# Patient Record
Sex: Male | Born: 1935 | Race: White | Hispanic: No | Marital: Married | Smoking: Former smoker
Health system: Southern US, Community
[De-identification: ages and names within clinical notes are randomized; demographics above are authoritative.]

## PROBLEM LIST (undated history)

## (undated) DIAGNOSIS — I2699 Other pulmonary embolism without acute cor pulmonale: Secondary | ICD-10-CM

## (undated) DIAGNOSIS — Z9289 Personal history of other medical treatment: Secondary | ICD-10-CM

## (undated) DIAGNOSIS — I2510 Atherosclerotic heart disease of native coronary artery without angina pectoris: Secondary | ICD-10-CM

## (undated) DIAGNOSIS — I272 Pulmonary hypertension, unspecified: Secondary | ICD-10-CM

## (undated) DIAGNOSIS — I10 Essential (primary) hypertension: Secondary | ICD-10-CM

## (undated) DIAGNOSIS — Z7901 Long term (current) use of anticoagulants: Secondary | ICD-10-CM

## (undated) DIAGNOSIS — R931 Abnormal findings on diagnostic imaging of heart and coronary circulation: Secondary | ICD-10-CM

## (undated) DIAGNOSIS — E785 Hyperlipidemia, unspecified: Secondary | ICD-10-CM

## (undated) DIAGNOSIS — I219 Acute myocardial infarction, unspecified: Secondary | ICD-10-CM

## (undated) DIAGNOSIS — I4821 Permanent atrial fibrillation: Secondary | ICD-10-CM

## (undated) HISTORY — DX: Personal history of other medical treatment: Z92.89

## (undated) HISTORY — DX: Long term (current) use of anticoagulants: Z79.01

## (undated) HISTORY — DX: Pulmonary hypertension, unspecified: I27.20

## (undated) HISTORY — DX: Hyperlipidemia, unspecified: E78.5

## (undated) HISTORY — DX: Permanent atrial fibrillation: I48.21

## (undated) HISTORY — DX: Abnormal findings on diagnostic imaging of heart and coronary circulation: R93.1

## (undated) HISTORY — DX: Other pulmonary embolism without acute cor pulmonale: I26.99

## (undated) HISTORY — PX: CORONARY ANGIOPLASTY WITH STENT PLACEMENT: SHX49

---

## 2009-05-26 DIAGNOSIS — Z7901 Long term (current) use of anticoagulants: Secondary | ICD-10-CM

## 2009-05-26 HISTORY — DX: Long term (current) use of anticoagulants: Z79.01

## 2009-07-23 ENCOUNTER — Ambulatory Visit: Payer: Self-pay | Admitting: Diagnostic Radiology

## 2009-07-23 ENCOUNTER — Emergency Department (HOSPITAL_BASED_OUTPATIENT_CLINIC_OR_DEPARTMENT_OTHER): Admission: EM | Admit: 2009-07-23 | Discharge: 2009-07-23 | Payer: Self-pay | Admitting: Emergency Medicine

## 2010-01-20 ENCOUNTER — Emergency Department (HOSPITAL_BASED_OUTPATIENT_CLINIC_OR_DEPARTMENT_OTHER): Admission: EM | Admit: 2010-01-20 | Discharge: 2010-01-20 | Payer: Self-pay | Admitting: Emergency Medicine

## 2010-05-23 ENCOUNTER — Inpatient Hospital Stay (HOSPITAL_COMMUNITY)
Admission: EM | Admit: 2010-05-23 | Discharge: 2010-06-04 | Payer: Self-pay | Source: Home / Self Care | Attending: Cardiovascular Disease | Admitting: Cardiovascular Disease

## 2010-05-23 DIAGNOSIS — I219 Acute myocardial infarction, unspecified: Secondary | ICD-10-CM

## 2010-05-23 DIAGNOSIS — I2699 Other pulmonary embolism without acute cor pulmonale: Secondary | ICD-10-CM

## 2010-05-23 HISTORY — DX: Other pulmonary embolism without acute cor pulmonale: I26.99

## 2010-05-23 HISTORY — DX: Acute myocardial infarction, unspecified: I21.9

## 2010-05-23 HISTORY — PX: CARDIAC CATHETERIZATION: SHX172

## 2010-05-26 DIAGNOSIS — I2510 Atherosclerotic heart disease of native coronary artery without angina pectoris: Secondary | ICD-10-CM

## 2010-05-26 HISTORY — DX: Atherosclerotic heart disease of native coronary artery without angina pectoris: I25.10

## 2010-05-28 HISTORY — PX: CARDIAC CATHETERIZATION: SHX172

## 2010-05-29 LAB — BASIC METABOLIC PANEL
BUN: 19 mg/dL (ref 6–23)
CO2: 27 mEq/L (ref 19–32)
Calcium: 8.8 mg/dL (ref 8.4–10.5)
Chloride: 109 mEq/L (ref 96–112)
Creatinine, Ser: 1.5 mg/dL (ref 0.4–1.5)
GFR calc Af Amer: 54 mL/min — ABNORMAL LOW (ref >60)
GFR calc non Af Amer: 44 mL/min — ABNORMAL LOW (ref >60)
Glucose, Bld: 106 mg/dL — ABNORMAL HIGH (ref 70–99)
Potassium: 4 mEq/L (ref 3.5–5.1)
Sodium: 140 mEq/L (ref 135–145)

## 2010-05-29 LAB — CBC
HCT: 38.2 % — ABNORMAL LOW (ref 39.0–52.0)
Hemoglobin: 12.6 g/dL — ABNORMAL LOW (ref 13.0–17.0)
MCH: 29.8 pg (ref 26.0–34.0)
MCHC: 33 g/dL (ref 30.0–36.0)
MCV: 90.3 fL (ref 78.0–100.0)
Platelets: 182 10*3/uL (ref 150–400)
RBC: 4.23 MIL/uL (ref 4.22–5.81)
RDW: 13.6 % (ref 11.5–15.5)
WBC: 6.6 10*3/uL (ref 4.0–10.5)

## 2010-05-29 LAB — D-DIMER, QUANTITATIVE: D-Dimer, Quant: 2.24 ug/mL-FEU — ABNORMAL HIGH (ref 0.00–0.48)

## 2010-05-30 LAB — BRAIN NATRIURETIC PEPTIDE: Pro B Natriuretic peptide (BNP): 632 pg/mL — ABNORMAL HIGH (ref 0.0–100.0)

## 2010-05-31 ENCOUNTER — Encounter (INDEPENDENT_AMBULATORY_CARE_PROVIDER_SITE_OTHER): Payer: Self-pay | Admitting: Cardiovascular Disease

## 2010-05-31 LAB — BASIC METABOLIC PANEL
BUN: 22 mg/dL (ref 6–23)
CO2: 30 mEq/L (ref 19–32)
Calcium: 9.2 mg/dL (ref 8.4–10.5)
Chloride: 100 mEq/L (ref 96–112)
Creatinine, Ser: 1.71 mg/dL — ABNORMAL HIGH (ref 0.4–1.5)
GFR calc Af Amer: 46 mL/min — ABNORMAL LOW (ref >60)
GFR calc non Af Amer: 38 mL/min — ABNORMAL LOW (ref >60)
Glucose, Bld: 97 mg/dL (ref 70–99)
Potassium: 4 mEq/L (ref 3.5–5.1)
Sodium: 139 mEq/L (ref 135–145)

## 2010-05-31 LAB — BRAIN NATRIURETIC PEPTIDE: Pro B Natriuretic peptide (BNP): 260 pg/mL — ABNORMAL HIGH (ref 0.0–100.0)

## 2010-06-10 LAB — PROTIME-INR
INR: 1.24 (ref 0.00–1.49)
INR: 1.89 — ABNORMAL HIGH (ref 0.00–1.49)
INR: 3.34 — ABNORMAL HIGH (ref 0.00–1.49)
INR: 3.68 — ABNORMAL HIGH (ref 0.00–1.49)
Prothrombin Time: 15.8 seconds — ABNORMAL HIGH (ref 11.6–15.2)
Prothrombin Time: 21.9 seconds — ABNORMAL HIGH (ref 11.6–15.2)
Prothrombin Time: 33.9 seconds — ABNORMAL HIGH (ref 11.6–15.2)
Prothrombin Time: 36.5 seconds — ABNORMAL HIGH (ref 11.6–15.2)

## 2010-06-10 LAB — CBC
HCT: 40.3 % (ref 39.0–52.0)
HCT: 38.1 % — ABNORMAL LOW (ref 39.0–52.0)
HCT: 37.8 % — ABNORMAL LOW (ref 39.0–52.0)
HCT: 37.7 % — ABNORMAL LOW (ref 39.0–52.0)
Hemoglobin: 13.2 g/dL (ref 13.0–17.0)
Hemoglobin: 12.7 g/dL — ABNORMAL LOW (ref 13.0–17.0)
Hemoglobin: 12.7 g/dL — ABNORMAL LOW (ref 13.0–17.0)
Hemoglobin: 12.5 g/dL — ABNORMAL LOW (ref 13.0–17.0)
MCH: 29.4 pg (ref 26.0–34.0)
MCH: 29.7 pg (ref 26.0–34.0)
MCH: 30 pg (ref 26.0–34.0)
MCH: 29.4 pg (ref 26.0–34.0)
MCHC: 32.8 g/dL (ref 30.0–36.0)
MCHC: 33.3 g/dL (ref 30.0–36.0)
MCHC: 33.6 g/dL (ref 30.0–36.0)
MCHC: 33.2 g/dL (ref 30.0–36.0)
MCV: 89.8 fL (ref 78.0–100.0)
MCV: 89 fL (ref 78.0–100.0)
MCV: 89.2 fL (ref 78.0–100.0)
MCV: 88.7 fL (ref 78.0–100.0)
Platelets: 245 10*3/uL (ref 150–400)
Platelets: 249 10*3/uL (ref 150–400)
Platelets: 242 10*3/uL (ref 150–400)
Platelets: 250 10*3/uL (ref 150–400)
RBC: 4.49 MIL/uL (ref 4.22–5.81)
RBC: 4.28 MIL/uL (ref 4.22–5.81)
RBC: 4.24 MIL/uL (ref 4.22–5.81)
RBC: 4.25 MIL/uL (ref 4.22–5.81)
RDW: 13.2 % (ref 11.5–15.5)
RDW: 13.3 % (ref 11.5–15.5)
RDW: 13.3 % (ref 11.5–15.5)
RDW: 13.1 % (ref 11.5–15.5)
WBC: 8.7 10*3/uL (ref 4.0–10.5)
WBC: 8.7 10*3/uL (ref 4.0–10.5)
WBC: 6.4 10*3/uL (ref 4.0–10.5)
WBC: 6.2 10*3/uL (ref 4.0–10.5)

## 2010-06-10 LAB — BASIC METABOLIC PANEL
BUN: 20 mg/dL (ref 6–23)
BUN: 24 mg/dL — ABNORMAL HIGH (ref 6–23)
CO2: 26 mEq/L (ref 19–32)
CO2: 26 mEq/L (ref 19–32)
Calcium: 8.9 mg/dL (ref 8.4–10.5)
Calcium: 8.9 mg/dL (ref 8.4–10.5)
Chloride: 105 mEq/L (ref 96–112)
Chloride: 105 mEq/L (ref 96–112)
Creatinine, Ser: 1.51 mg/dL — ABNORMAL HIGH (ref 0.4–1.5)
Creatinine, Ser: 1.52 mg/dL — ABNORMAL HIGH (ref 0.4–1.5)
GFR calc Af Amer: 53 mL/min — ABNORMAL LOW (ref >60)
GFR calc Af Amer: 53 mL/min — ABNORMAL LOW (ref >60)
GFR calc non Af Amer: 44 mL/min — ABNORMAL LOW (ref >60)
GFR calc non Af Amer: 44 mL/min — ABNORMAL LOW (ref >60)
Glucose, Bld: 105 mg/dL — ABNORMAL HIGH (ref 70–99)
Glucose, Bld: 125 mg/dL — ABNORMAL HIGH (ref 70–99)
Potassium: 3.9 mEq/L (ref 3.5–5.1)
Potassium: 3.8 mEq/L (ref 3.5–5.1)
Sodium: 138 mEq/L (ref 135–145)
Sodium: 139 mEq/L (ref 135–145)

## 2010-06-10 LAB — GLUCOSE, CAPILLARY: Glucose-Capillary: 132 mg/dL — ABNORMAL HIGH (ref 70–99)

## 2010-06-10 LAB — HEPARIN LEVEL (UNFRACTIONATED)
Heparin Unfractionated: 0.77 IU/mL — ABNORMAL HIGH (ref 0.30–0.70)
Heparin Unfractionated: 0.54 IU/mL (ref 0.30–0.70)
Heparin Unfractionated: 0.22 IU/mL — ABNORMAL LOW (ref 0.30–0.70)
Heparin Unfractionated: 0.46 IU/mL (ref 0.30–0.70)
Heparin Unfractionated: 0.79 IU/mL — ABNORMAL HIGH (ref 0.30–0.70)
Heparin Unfractionated: 0.95 IU/mL — ABNORMAL HIGH (ref 0.30–0.70)
Heparin Unfractionated: 0.47 IU/mL (ref 0.30–0.70)

## 2010-08-05 LAB — CBC
HCT: 36.6 % — ABNORMAL LOW (ref 39.0–52.0)
HCT: 37.6 % — ABNORMAL LOW (ref 39.0–52.0)
HCT: 38.7 % — ABNORMAL LOW (ref 39.0–52.0)
HCT: 42.6 % (ref 39.0–52.0)
Hemoglobin: 12.1 g/dL — ABNORMAL LOW (ref 13.0–17.0)
Hemoglobin: 13.7 g/dL (ref 13.0–17.0)
MCH: 28.8 pg (ref 26.0–34.0)
MCH: 30 pg (ref 26.0–34.0)
MCH: 30 pg (ref 26.0–34.0)
MCHC: 32.2 g/dL (ref 30.0–36.0)
MCHC: 32.8 g/dL (ref 30.0–36.0)
MCHC: 33.1 g/dL (ref 30.0–36.0)
MCHC: 33.3 g/dL (ref 30.0–36.0)
MCV: 89.7 fL (ref 78.0–100.0)
MCV: 90.6 fL (ref 78.0–100.0)
MCV: 90.6 fL (ref 78.0–100.0)
MCV: 90.8 fL (ref 78.0–100.0)
Platelets: 163 10*3/uL (ref 150–400)
Platelets: 190 10*3/uL (ref 150–400)
RBC: 4.04 MIL/uL — ABNORMAL LOW (ref 4.22–5.81)
RBC: 4.14 MIL/uL — ABNORMAL LOW (ref 4.22–5.81)
RBC: 4.75 MIL/uL (ref 4.22–5.81)
RDW: 13.7 % (ref 11.5–15.5)
RDW: 13.7 % (ref 11.5–15.5)
RDW: 13.8 % (ref 11.5–15.5)
RDW: 14 % (ref 11.5–15.5)
RDW: 14 % (ref 11.5–15.5)
WBC: 7.2 10*3/uL (ref 4.0–10.5)
WBC: 7.7 10*3/uL (ref 4.0–10.5)
WBC: 8.4 10*3/uL (ref 4.0–10.5)

## 2010-08-05 LAB — DIFFERENTIAL
Basophils Absolute: 0 10*3/uL (ref 0.0–0.1)
Basophils Relative: 0 % (ref 0–1)
Eosinophils Absolute: 0.1 10*3/uL (ref 0.0–0.7)
Eosinophils Relative: 1 % (ref 0–5)
Lymphocytes Relative: 12 % (ref 12–46)
Lymphs Abs: 1 10*3/uL (ref 0.7–4.0)
Monocytes Absolute: 0.6 10*3/uL (ref 0.1–1.0)
Monocytes Relative: 7 % (ref 3–12)
Neutro Abs: 6.7 10*3/uL (ref 1.7–7.7)
Neutrophils Relative %: 80 % — ABNORMAL HIGH (ref 43–77)

## 2010-08-05 LAB — BASIC METABOLIC PANEL
BUN: 20 mg/dL (ref 6–23)
BUN: 20 mg/dL (ref 6–23)
BUN: 26 mg/dL — ABNORMAL HIGH (ref 6–23)
BUN: 28 mg/dL — ABNORMAL HIGH (ref 6–23)
CO2: 26 mEq/L (ref 19–32)
CO2: 28 mEq/L (ref 19–32)
Calcium: 8.4 mg/dL (ref 8.4–10.5)
Calcium: 8.8 mg/dL (ref 8.4–10.5)
Calcium: 9.2 mg/dL (ref 8.4–10.5)
Chloride: 104 mEq/L (ref 96–112)
Chloride: 105 mEq/L (ref 96–112)
Chloride: 106 mEq/L (ref 96–112)
Creatinine, Ser: 1.33 mg/dL (ref 0.4–1.5)
Creatinine, Ser: 1.41 mg/dL (ref 0.4–1.5)
Creatinine, Ser: 1.66 mg/dL — ABNORMAL HIGH (ref 0.4–1.5)
GFR calc Af Amer: 48 mL/min — ABNORMAL LOW (ref >60)
GFR calc Af Amer: 58 mL/min — ABNORMAL LOW (ref >60)
GFR calc non Af Amer: 39 mL/min — ABNORMAL LOW (ref >60)
GFR calc non Af Amer: 48 mL/min — ABNORMAL LOW (ref >60)
GFR calc non Af Amer: 51 mL/min — ABNORMAL LOW (ref >60)
Glucose, Bld: 101 mg/dL — ABNORMAL HIGH (ref 70–99)
Glucose, Bld: 107 mg/dL — ABNORMAL HIGH (ref 70–99)
Glucose, Bld: 108 mg/dL — ABNORMAL HIGH (ref 70–99)
Glucose, Bld: 94 mg/dL (ref 70–99)
Potassium: 4 mEq/L (ref 3.5–5.1)
Potassium: 4 mEq/L (ref 3.5–5.1)
Potassium: 4.1 mEq/L (ref 3.5–5.1)
Sodium: 137 mEq/L (ref 135–145)
Sodium: 141 mEq/L (ref 135–145)

## 2010-08-05 LAB — CARDIAC PANEL(CRET KIN+CKTOT+MB+TROPI)
CK, MB: 138.1 ng/mL (ref 0.3–4.0)
CK, MB: 77.3 ng/mL (ref 0.3–4.0)
Relative Index: 12.8 — ABNORMAL HIGH (ref 0.0–2.5)
Relative Index: 15.6 — ABNORMAL HIGH (ref 0.0–2.5)
Total CK: 605 U/L — ABNORMAL HIGH (ref 7–232)
Total CK: 887 U/L — ABNORMAL HIGH (ref 7–232)
Troponin I: 31.68 ng/mL (ref 0.00–0.06)
Troponin I: 51.03 ng/mL (ref 0.00–0.06)

## 2010-08-05 LAB — COMPREHENSIVE METABOLIC PANEL
ALT: 30 U/L (ref 0–53)
AST: 85 U/L — ABNORMAL HIGH (ref 0–37)
Albumin: 3.8 g/dL (ref 3.5–5.2)
Alkaline Phosphatase: 39 U/L (ref 39–117)
BUN: 23 mg/dL (ref 6–23)
CO2: 28 mEq/L (ref 19–32)
Calcium: 9.2 mg/dL (ref 8.4–10.5)
Chloride: 101 mEq/L (ref 96–112)
Creatinine, Ser: 1.41 mg/dL (ref 0.4–1.5)
GFR calc Af Amer: 58 mL/min — ABNORMAL LOW (ref >60)
GFR calc non Af Amer: 48 mL/min — ABNORMAL LOW (ref >60)
Glucose, Bld: 117 mg/dL — ABNORMAL HIGH (ref 70–99)
Potassium: 3.7 mEq/L (ref 3.5–5.1)
Sodium: 137 mEq/L (ref 135–145)
Total Bilirubin: 1.1 mg/dL (ref 0.3–1.2)
Total Protein: 6.4 g/dL (ref 6.0–8.3)

## 2010-08-05 LAB — HEMOGLOBIN A1C
Hgb A1c MFr Bld: 6 % — ABNORMAL HIGH (ref <5.7)
Mean Plasma Glucose: 126 mg/dL — ABNORMAL HIGH (ref <117)

## 2010-08-05 LAB — PLATELET COUNT: Platelets: 185 10*3/uL (ref 150–400)

## 2010-08-05 LAB — LIPID PANEL
Cholesterol: 131 mg/dL (ref 0–200)
HDL: 38 mg/dL — ABNORMAL LOW (ref >39)
LDL Cholesterol: 80 mg/dL (ref 0–99)
Total CHOL/HDL Ratio: 3.4 RATIO
Triglycerides: 65 mg/dL (ref <150)
VLDL: 13 mg/dL (ref 0–40)

## 2010-08-05 LAB — POCT CARDIAC MARKERS
CKMB, poc: 65 ng/mL (ref 1.0–8.0)
Myoglobin, poc: >500 ng/mL (ref 12–200)
Troponin i, poc: 9.74 ng/mL (ref 0.00–0.09)

## 2010-08-05 LAB — BRAIN NATRIURETIC PEPTIDE: Pro B Natriuretic peptide (BNP): 194 pg/mL — ABNORMAL HIGH (ref 0.0–100.0)

## 2010-08-05 LAB — TROPONIN I: Troponin I: 20.07 ng/mL (ref 0.00–0.06)

## 2010-08-05 LAB — MAGNESIUM: Magnesium: 1.9 mg/dL (ref 1.5–2.5)

## 2010-08-05 LAB — PROTIME-INR
INR: 1.18 (ref 0.00–1.49)
INR: 1.21 (ref 0.00–1.49)
Prothrombin Time: 15.2 seconds (ref 11.6–15.2)
Prothrombin Time: 15.5 seconds — ABNORMAL HIGH (ref 11.6–15.2)

## 2010-08-05 LAB — APTT: aPTT: 193 seconds — ABNORMAL HIGH (ref 24–37)

## 2010-08-05 LAB — CK TOTAL AND CKMB (NOT AT ARMC)
CK, MB: 127.6 ng/mL (ref 0.3–4.0)
Relative Index: 17.1 — ABNORMAL HIGH (ref 0.0–2.5)
Total CK: 748 U/L — ABNORMAL HIGH (ref 7–232)

## 2010-08-05 LAB — LIPASE, BLOOD: Lipase: 29 U/L (ref 11–59)

## 2010-08-05 LAB — MRSA PCR SCREENING: MRSA by PCR: NEGATIVE

## 2010-08-09 LAB — BASIC METABOLIC PANEL
BUN: 22 mg/dL (ref 6–23)
CO2: 26 mEq/L (ref 19–32)
Calcium: 9.6 mg/dL (ref 8.4–10.5)
Creatinine, Ser: 1.5 mg/dL (ref 0.4–1.5)
GFR calc Af Amer: 54 mL/min — ABNORMAL LOW (ref >60)
Glucose, Bld: 130 mg/dL — ABNORMAL HIGH (ref 70–99)

## 2010-08-09 LAB — URINALYSIS, ROUTINE W REFLEX MICROSCOPIC
Bilirubin Urine: NEGATIVE
Ketones, ur: NEGATIVE mg/dL
Nitrite: NEGATIVE
pH: 6 (ref 5.0–8.0)

## 2010-08-09 LAB — DIFFERENTIAL
Basophils Absolute: 0.1 10*3/uL (ref 0.0–0.1)
Basophils Relative: 1 % (ref 0–1)
Eosinophils Absolute: 0 10*3/uL (ref 0.0–0.7)
Neutrophils Relative %: 87 % — ABNORMAL HIGH (ref 43–77)

## 2010-08-09 LAB — CBC
MCH: 31.9 pg (ref 26.0–34.0)
MCHC: 34.1 g/dL (ref 30.0–36.0)
Platelets: 227 10*3/uL (ref 150–400)
RBC: 5.05 MIL/uL (ref 4.22–5.81)
RDW: 13.7 % (ref 11.5–15.5)

## 2010-08-09 LAB — URINE CULTURE
Colony Count: NO GROWTH
Culture  Setup Time: 201108281945

## 2010-08-09 LAB — URINE MICROSCOPIC-ADD ON

## 2010-08-15 LAB — DIFFERENTIAL
Basophils Absolute: 0.1 10*3/uL (ref 0.0–0.1)
Lymphocytes Relative: 17 % (ref 12–46)
Monocytes Absolute: 0.4 10*3/uL (ref 0.1–1.0)
Monocytes Relative: 6 % (ref 3–12)
Neutro Abs: 4.4 10*3/uL (ref 1.7–7.7)
Neutrophils Relative %: 69 % (ref 43–77)

## 2010-08-15 LAB — BASIC METABOLIC PANEL
Calcium: 9.7 mg/dL (ref 8.4–10.5)
Creatinine, Ser: 1.5 mg/dL (ref 0.4–1.5)
GFR calc Af Amer: 54 mL/min — ABNORMAL LOW (ref >60)
GFR calc non Af Amer: 44 mL/min — ABNORMAL LOW (ref >60)
Sodium: 144 mEq/L (ref 135–145)

## 2010-08-15 LAB — CBC
Hemoglobin: 15 g/dL (ref 13.0–17.0)
RBC: 5.06 MIL/uL (ref 4.22–5.81)

## 2010-11-07 DIAGNOSIS — R931 Abnormal findings on diagnostic imaging of heart and coronary circulation: Secondary | ICD-10-CM

## 2010-11-07 DIAGNOSIS — Z9289 Personal history of other medical treatment: Secondary | ICD-10-CM

## 2010-11-07 HISTORY — DX: Abnormal findings on diagnostic imaging of heart and coronary circulation: R93.1

## 2010-11-07 HISTORY — DX: Personal history of other medical treatment: Z92.89

## 2011-01-05 ENCOUNTER — Emergency Department (HOSPITAL_BASED_OUTPATIENT_CLINIC_OR_DEPARTMENT_OTHER)
Admission: EM | Admit: 2011-01-05 | Discharge: 2011-01-05 | Disposition: A | Discharge status: Home / Self Care | Payer: Medicare Other | Attending: Emergency Medicine | Admitting: Emergency Medicine

## 2011-01-05 ENCOUNTER — Emergency Department (INDEPENDENT_AMBULATORY_CARE_PROVIDER_SITE_OTHER): Payer: Medicare Other

## 2011-01-05 ENCOUNTER — Other Ambulatory Visit: Payer: Self-pay

## 2011-01-05 ENCOUNTER — Encounter: Payer: Self-pay | Admitting: Emergency Medicine

## 2011-01-05 DIAGNOSIS — R0602 Shortness of breath: Secondary | ICD-10-CM

## 2011-01-05 DIAGNOSIS — R0989 Other specified symptoms and signs involving the circulatory and respiratory systems: Secondary | ICD-10-CM

## 2011-01-05 DIAGNOSIS — I509 Heart failure, unspecified: Secondary | ICD-10-CM | POA: Insufficient documentation

## 2011-01-05 DIAGNOSIS — M7989 Other specified soft tissue disorders: Secondary | ICD-10-CM | POA: Insufficient documentation

## 2011-01-05 DIAGNOSIS — J40 Bronchitis, not specified as acute or chronic: Secondary | ICD-10-CM | POA: Insufficient documentation

## 2011-01-05 DIAGNOSIS — J811 Chronic pulmonary edema: Secondary | ICD-10-CM

## 2011-01-05 DIAGNOSIS — J90 Pleural effusion, not elsewhere classified: Secondary | ICD-10-CM

## 2011-01-05 DIAGNOSIS — R05 Cough: Secondary | ICD-10-CM | POA: Insufficient documentation

## 2011-01-05 DIAGNOSIS — R059 Cough, unspecified: Secondary | ICD-10-CM | POA: Insufficient documentation

## 2011-01-05 DIAGNOSIS — I2510 Atherosclerotic heart disease of native coronary artery without angina pectoris: Secondary | ICD-10-CM | POA: Insufficient documentation

## 2011-01-05 DIAGNOSIS — I10 Essential (primary) hypertension: Secondary | ICD-10-CM | POA: Insufficient documentation

## 2011-01-05 DIAGNOSIS — I252 Old myocardial infarction: Secondary | ICD-10-CM | POA: Insufficient documentation

## 2011-01-05 HISTORY — DX: Acute myocardial infarction, unspecified: I21.9

## 2011-01-05 HISTORY — DX: Atherosclerotic heart disease of native coronary artery without angina pectoris: I25.10

## 2011-01-05 HISTORY — DX: Essential (primary) hypertension: I10

## 2011-01-05 LAB — COMPREHENSIVE METABOLIC PANEL
ALT: 18 U/L (ref 0–53)
AST: 23 U/L (ref 0–37)
CO2: 28 mEq/L (ref 19–32)
Calcium: 9.6 mg/dL (ref 8.4–10.5)
Chloride: 100 mEq/L (ref 96–112)
Creatinine, Ser: 1.2 mg/dL (ref 0.50–1.35)
GFR calc Af Amer: >60 mL/min (ref >60)
GFR calc non Af Amer: 57 mL/min — ABNORMAL LOW (ref >60)
Glucose, Bld: 111 mg/dL — ABNORMAL HIGH (ref 70–99)
Total Bilirubin: 1.3 mg/dL — ABNORMAL HIGH (ref 0.3–1.2)

## 2011-01-05 LAB — CBC
HCT: 40.3 % (ref 39.0–52.0)
Hemoglobin: 13.3 g/dL (ref 13.0–17.0)
MCH: 28.7 pg (ref 26.0–34.0)
MCHC: 33 g/dL (ref 30.0–36.0)
RBC: 4.63 MIL/uL (ref 4.22–5.81)

## 2011-01-05 LAB — DIFFERENTIAL
Eosinophils Absolute: 0 10*3/uL (ref 0.0–0.7)
Eosinophils Relative: 0 % (ref 0–5)
Lymphocytes Relative: 7 % — ABNORMAL LOW (ref 12–46)
Lymphs Abs: 0.8 10*3/uL (ref 0.7–4.0)
Monocytes Relative: 7 % (ref 3–12)

## 2011-01-05 LAB — PROTIME-INR: INR: 3.87 — ABNORMAL HIGH (ref 0.00–1.49)

## 2011-01-05 MED ORDER — ALBUTEROL SULFATE HFA 108 (90 BASE) MCG/ACT IN AERS: 2.0000 | INHALATION_SPRAY | RESPIRATORY_TRACT | Status: DC | PRN

## 2011-01-05 MED ORDER — ALBUTEROL SULFATE (5 MG/ML) 0.5% IN NEBU
2.5000 mg | INHALATION_SOLUTION | Freq: Once | RESPIRATORY_TRACT | Status: AC
  Administered 2011-01-05: 2.5 mg via RESPIRATORY_TRACT
  Filled 2011-01-05: qty 0.5

## 2011-01-05 MED ORDER — FUROSEMIDE 40 MG PO TABS: 40.0000 mg | ORAL_TABLET | Freq: Every day | ORAL | Status: DC

## 2011-01-05 NOTE — ED Notes (Signed)
Pt states he has been having dyspnea on exertion, swelling in feet and cough for over 6 months.  Pt states swelling in feet has worsened and wants to get checked out.  Pt states he feels sl Sob, has cough with yellow sputum.  No acute respiratory distress noted.  1+ pitting edema noted in feet/ankles up to shin.

## 2011-01-05 NOTE — ED Notes (Signed)
The patient is undressed and in a gown. The bed rails are up and the bed is locked and in the lowest position. The call light is within reach. A warm blanket has been given to the patient.

## 2011-01-05 NOTE — ED Notes (Signed)
Family at bedside. 

## 2011-01-05 NOTE — ED Provider Notes (Signed)
History     CSN: [id number] Arrival date & time: 01/05/2011 10:44 AM  Chief Complaint  Patient presents with  . Foot Swelling  . Cough  . Shortness of Breath   Patient is a 77 y.o. male presenting with cough. The history is provided by the patient.  Cough This is a new problem. Episode onset: Cough started about one week ago. The cough is non-productive. There has been no fever. Associated symptoms include shortness of breath and wheezing. Pertinent negatives include no chest pain, no sweats, no rhinorrhea and no myalgias. He has tried nothing for the symptoms.  Symptoms are worse at night, and he has had to sleep sitting up. He has had post-tussive emesis. He has chronic swelling of his feet which he states is unchanged.  Past Medical History  Diagnosis Date  . Coronary artery disease   . Hypertension   . Myocardial infarction     Past Surgical History  Procedure Date  . Cardiac surgery     two stents placed    History reviewed. No pertinent family history.  History  Substance Use Topics  . Smoking status: Never Smoker   . Smokeless tobacco: Not on file  . Alcohol Use: No      Review of Systems  HENT: Negative for rhinorrhea.   Respiratory: Positive for cough, shortness of breath and wheezing.   Cardiovascular: Negative for chest pain.  Musculoskeletal: Negative for myalgias.  All other systems reviewed and are negative.    Physical Exam  BP 138/88  Pulse 82  Temp(Src) 98.1 F (36.7 C) (Oral)  Resp 20  SpO2 96%  Physical Exam  Nursing note and vitals reviewed. Constitutional: He is oriented to person, place, and time. He appears well-developed and well-nourished. No distress.  HENT:  Head: Normocephalic and atraumatic.  Right Ear: External ear normal.  Left Ear: External ear normal.  Nose: Nose normal.  Mouth/Throat: Oropharynx is clear and moist.  Eyes: Conjunctivae and EOM are normal. Pupils are equal, round, and reactive to light. No scleral  icterus.  Neck: Normal range of motion. Neck supple. No JVD present.  Cardiovascular: Normal rate and normal heart sounds.   No murmur heard. Pulmonary/Chest: Effort normal. No respiratory distress. He has wheezes. He has rales.       Bibasilar rales, scattered wheezes that do not sound like cardiac asthma.  Abdominal: Soft. Bowel sounds are normal. He exhibits no mass. There is no tenderness.  Musculoskeletal: Normal range of motion. He exhibits edema. He exhibits no tenderness.       2+ presacral edema, 3+ pretibial edema.  Lymphadenopathy:    He has no cervical adenopathy.  Neurological: He is alert and oriented to person, place, and time. No cranial nerve deficit. Coordination normal.  Skin: Skin is warm and dry. No rash noted.  Psychiatric: He has a normal mood and affect.    ED Course  Procedures  Date: 01/05/2011  Rate: 84  Rhythm: atrial fibrillation, PVC's  QRS Axis: normal  Intervals: normal  ST/T Wave abnormalities: normal  Conduction Disutrbances:none  Narrative Interpretation: Atrial fibrillation with low QRS voltage  Old EKG Reviewed: unchanged           MDM Bronchitis and CHF, need to get CXR to rule out pneumonia.  Significant improvement with Albuterol Will need to treat for CHF and Bronchitis.  Labs and x-rays reviewed, images viewed by me.  Results for orders placed during the hospital encounter of 01/05/11  CBC  Component Value Range   WBC 11.9 (*) 4.0 - 10.5 (K/uL)   RBC 4.63  4.22 - 5.81 (MIL/uL)   Hemoglobin 13.3  13.0 - 17.0 (g/dL)   HCT 16.1  09.6 - 04.5 (%)   MCV 87.0  78.0 - 100.0 (fL)   MCH 28.7  26.0 - 34.0 (pg)   MCHC 33.0  30.0 - 36.0 (g/dL)   RDW 40.9  81.1 - 91.4 (%)   Platelets 200  150 - 400 (K/uL)  DIFFERENTIAL      Component Value Range   Neutrophils Relative 86 (*) 43 - 77 (%)   Neutro Abs 10.3 (*) 1.7 - 7.7 (K/uL)   Lymphocytes Relative 7 (*) 12 - 46 (%)   Lymphs Abs 0.8  0.7 - 4.0 (K/uL)   Monocytes Relative 7  3 -  12 (%)   Monocytes Absolute 0.9  0.1 - 1.0 (K/uL)   Eosinophils Relative 0  0 - 5 (%)   Eosinophils Absolute 0.0  0.0 - 0.7 (K/uL)   Basophils Relative 0  0 - 1 (%)   Basophils Absolute 0.0  0.0 - 0.1 (K/uL)   RBC Morphology POLYCHROMASIA PRESENT    COMPREHENSIVE METABOLIC PANEL      Component Value Range   Sodium 141  135 - 145 (mEq/L)   Potassium 3.2 (*) 3.5 - 5.1 (mEq/L)   Chloride 100  96 - 112 (mEq/L)   CO2 28  19 - 32 (mEq/L)   Glucose, Bld 111 (*) 70 - 99 (mg/dL)   BUN 21  6 - 23 (mg/dL)   Creatinine, Ser 7.82  0.50 - 1.35 (mg/dL)   Calcium 9.6  8.4 - 95.6 (mg/dL)   Total Protein 7.1  6.0 - 8.3 (g/dL)   Albumin 3.1 (*) 3.5 - 5.2 (g/dL)   AST 23  0 - 37 (U/L)   ALT 18  0 - 53 (U/L)   Alkaline Phosphatase 63  39 - 117 (U/L)   Total Bilirubin 1.3 (*) 0.3 - 1.2 (mg/dL)   GFR calc non Af Amer 57 (*) >60 (mL/min)   GFR calc Af Amer >60  >60 (mL/min)  PROTIME-INR      Component Value Range   Prothrombin Time 38.6 (*) 11.6 - 15.2 (seconds)   INR 3.87 (*) 0.00 - 1.49   TROPONIN I      Component Value Range   Troponin I <0.30  <0.30 (ng/mL)  PRO B NATRIURETIC PEPTIDE      Component Value Range   BNP, POC 2256.0 (*) 0 - 450 (pg/mL)   Dg Chest 2 View  01/05/2011  *RADIOLOGY REPORT*  Clinical Data: Shortness of breath, dyspnea on exertion  CHEST - 2 VIEW  Comparison: 05/29/2010  Findings: Mild perihilar and bibasilar interstitial edema or infiltrates.  Interval decrease in small pleural effusions.  Stable mild cardiomegaly.  Spondylitic changes in the thoracic spine.  IMPRESSION:  1.  Mild interstitial edema with interval decrease in small effusions.  Original Report Authenticated By: Osa Craver, M.D.      Dione Booze, MD 01/05/11 1335

## 2011-01-05 NOTE — ED Notes (Signed)
nd family verablize care plan and follow up well pt reports "breathing well now"

## 2012-05-11 ENCOUNTER — Encounter: Payer: Self-pay | Admitting: Pharmacist Clinician (PhC)/ Clinical Pharmacy Specialist

## 2012-07-16 ENCOUNTER — Ambulatory Visit
Admission: RE | Admit: 2012-07-16 | Discharge: 2012-07-16 | Disposition: A | Discharge status: Home / Self Care | Payer: Medicare Other | Source: Ambulatory Visit | Attending: Internal Medicine | Admitting: Internal Medicine

## 2012-07-16 ENCOUNTER — Other Ambulatory Visit: Payer: Self-pay | Admitting: Internal Medicine

## 2012-07-16 DIAGNOSIS — R7989 Other specified abnormal findings of blood chemistry: Secondary | ICD-10-CM

## 2012-08-10 ENCOUNTER — Ambulatory Visit: Payer: Self-pay | Admitting: Cardiovascular Disease

## 2012-08-10 DIAGNOSIS — Z7901 Long term (current) use of anticoagulants: Secondary | ICD-10-CM | POA: Insufficient documentation

## 2012-08-10 DIAGNOSIS — I2699 Other pulmonary embolism without acute cor pulmonale: Secondary | ICD-10-CM | POA: Insufficient documentation

## 2012-08-10 DIAGNOSIS — I4891 Unspecified atrial fibrillation: Secondary | ICD-10-CM | POA: Insufficient documentation

## 2012-10-13 ENCOUNTER — Ambulatory Visit: Payer: Medicare Other | Admitting: Pharmacist Clinician (PhC)/ Clinical Pharmacy Specialist

## 2012-10-21 ENCOUNTER — Ambulatory Visit (INDEPENDENT_AMBULATORY_CARE_PROVIDER_SITE_OTHER): Payer: Medicare Other | Admitting: Pharmacist Clinician (PhC)/ Clinical Pharmacy Specialist

## 2012-10-21 VITALS — BP 132/80 | HR 72

## 2012-10-21 DIAGNOSIS — I4891 Unspecified atrial fibrillation: Secondary | ICD-10-CM

## 2012-10-21 DIAGNOSIS — I2699 Other pulmonary embolism without acute cor pulmonale: Secondary | ICD-10-CM

## 2012-10-21 DIAGNOSIS — Z7901 Long term (current) use of anticoagulants: Secondary | ICD-10-CM

## 2012-11-04 ENCOUNTER — Encounter: Payer: Self-pay | Admitting: Cardiovascular Disease

## 2012-12-08 ENCOUNTER — Other Ambulatory Visit: Payer: Self-pay

## 2012-12-08 MED ORDER — FUROSEMIDE 40 MG PO TABS: 40.0000 mg | ORAL_TABLET | Freq: Every day | ORAL | Status: DC

## 2012-12-08 MED ORDER — PANTOPRAZOLE SODIUM 40 MG PO TBEC: 40.0000 mg | DELAYED_RELEASE_TABLET | Freq: Every day | ORAL | Status: DC

## 2012-12-08 MED ORDER — METOPROLOL TARTRATE 50 MG PO TABS: 50.0000 mg | ORAL_TABLET | Freq: Two times a day (BID) | ORAL | Status: DC

## 2012-12-08 MED ORDER — WARFARIN SODIUM 5 MG PO TABS: ORAL_TABLET | ORAL | Status: DC

## 2012-12-08 MED ORDER — DILTIAZEM HCL ER COATED BEADS 120 MG PO CP24: 120.0000 mg | ORAL_CAPSULE | Freq: Every day | ORAL | Status: DC

## 2012-12-08 NOTE — Telephone Encounter (Signed)
Rx was sent to pharmacy electronically. 

## 2012-12-10 ENCOUNTER — Other Ambulatory Visit: Payer: Self-pay | Admitting: *Deleted

## 2012-12-10 ENCOUNTER — Other Ambulatory Visit: Payer: Self-pay | Admitting: Pharmacist Clinician (PhC)/ Clinical Pharmacy Specialist

## 2012-12-10 MED ORDER — WARFARIN SODIUM 5 MG PO TABS: ORAL_TABLET | ORAL | Status: DC

## 2013-02-07 ENCOUNTER — Ambulatory Visit: Payer: Self-pay | Admitting: Pharmacist Clinician (PhC)/ Clinical Pharmacy Specialist

## 2013-02-07 DIAGNOSIS — I4891 Unspecified atrial fibrillation: Secondary | ICD-10-CM

## 2013-02-07 DIAGNOSIS — Z7901 Long term (current) use of anticoagulants: Secondary | ICD-10-CM

## 2013-02-07 DIAGNOSIS — I2699 Other pulmonary embolism without acute cor pulmonale: Secondary | ICD-10-CM

## 2013-03-28 ENCOUNTER — Encounter: Payer: Self-pay | Admitting: Cardiology

## 2013-03-28 DIAGNOSIS — I10 Essential (primary) hypertension: Secondary | ICD-10-CM | POA: Insufficient documentation

## 2013-03-28 DIAGNOSIS — I2699 Other pulmonary embolism without acute cor pulmonale: Secondary | ICD-10-CM | POA: Insufficient documentation

## 2013-03-28 DIAGNOSIS — I2510 Atherosclerotic heart disease of native coronary artery without angina pectoris: Secondary | ICD-10-CM

## 2013-03-28 DIAGNOSIS — E785 Hyperlipidemia, unspecified: Secondary | ICD-10-CM | POA: Insufficient documentation

## 2013-03-28 DIAGNOSIS — I4821 Permanent atrial fibrillation: Secondary | ICD-10-CM | POA: Insufficient documentation

## 2013-03-28 DIAGNOSIS — I219 Acute myocardial infarction, unspecified: Secondary | ICD-10-CM

## 2013-03-29 ENCOUNTER — Ambulatory Visit (INDEPENDENT_AMBULATORY_CARE_PROVIDER_SITE_OTHER): Payer: Medicare Other | Admitting: Cardiology

## 2013-03-29 ENCOUNTER — Encounter: Payer: Self-pay | Admitting: Cardiology

## 2013-03-29 VITALS — BP 140/80 | HR 83 | Ht 67.0 in | Wt 168.0 lb

## 2013-03-29 DIAGNOSIS — Z7901 Long term (current) use of anticoagulants: Secondary | ICD-10-CM

## 2013-03-29 DIAGNOSIS — I4891 Unspecified atrial fibrillation: Secondary | ICD-10-CM

## 2013-03-29 DIAGNOSIS — I4821 Permanent atrial fibrillation: Secondary | ICD-10-CM

## 2013-03-29 DIAGNOSIS — I2510 Atherosclerotic heart disease of native coronary artery without angina pectoris: Secondary | ICD-10-CM

## 2013-03-29 DIAGNOSIS — E785 Hyperlipidemia, unspecified: Secondary | ICD-10-CM

## 2013-03-29 NOTE — Assessment & Plan Note (Signed)
Rate controlled atrial fib, denies rapid HR.

## 2013-03-29 NOTE — Progress Notes (Signed)
03/29/2013   PCP: Juline Patch, MD   Chief Complaint  Patient presents with  . Follow-up    6 month visit    Primary Cardiologist: Dr. Allyson Sabal  HPI:  79 year old white married male is here today for cardiology followup. His primary cardiologist is Dr. Allyson Sabal. He has a history of coronary artery disease or with non-ST elevation MI in the past he had staged LAD and circumflex intervention by Dr. Tresa Endo in January of 2012 with Promus drug alluding stents. Other history includes pulmonary emboli, chronic/permanent atrial fibrillation on anticoagulation with Coumadin. He also has hypertension and hyperlipidemia. Last echo was June 2012 with normal LV systolic function mild posterior and inferior Hypokinesia with moderate TR mild to moderate pulmonary hypertension and moderate MR.   Myoview at that time revealed his inferior lateral scar from his MI.  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 He denies chest pain denies shortness of breath his only complaint is some burning in his legs with ambulation Dr. Ricki Miller has been following this.  I do not have her recent lipid panel we will have him have this drawn next week with Dr. Lynne Logan office.   Dr. Jason Fila follows his Coumadin checks.  This is an active 79 year old who does not appear his stated age.    No Known Allergies  Current Outpatient Prescriptions  Medication Sig Dispense Refill  . albuterol (PROVENTIL) (2.5 MG/3ML) 0.083% nebulizer solution Take 2.5 mg by nebulization every 6 (six) hours as needed for wheezing or shortness of breath.      Marland Kitchen aspirin 81 MG EC tablet Take 81 mg by mouth daily.        Marland Kitchen diltiazem (CARDIZEM CD) 120 MG 24 hr capsule Take 1 capsule (120 mg total) by mouth daily.  90 capsule  3  . furosemide (LASIX) 40 MG tablet Take 1 tablet (40 mg total) by mouth daily.  90 tablet  3  . latanoprost (XALATAN) 0.005 % ophthalmic solution Place 1 drop into both eyes at  bedtime.      Marland Kitchen lisinopril (PRINIVIL,ZESTRIL) 20 MG tablet Take 20 mg by mouth daily.        . metoprolol (LOPRESSOR) 50 MG tablet Take 1 tablet (50 mg total) by mouth 2 (two) times daily.  180 tablet  3  . nitroGLYCERIN (NITROSTAT) 0.4 MG SL tablet Place 0.4 mg under the tongue every 5 (five) minutes as needed.      . pantoprazole (PROTONIX) 40 MG tablet Take 1 tablet (40 mg total) by mouth daily.  90 tablet  3  . pravastatin (PRAVACHOL) 40 MG tablet Take 40 mg by mouth daily.      . predniSONE (DELTASONE) 5 MG tablet Take 5 mg by mouth daily.        . Tamsulosin HCl (FLOMAX) 0.4 MG CAPS Take 0.4 mg by mouth at bedtime.        Marland Kitchen warfarin (COUMADIN) 5 MG tablet Take 1 tablet by mouth daily  90 tablet  0  . albuterol (PROVENTIL HFA;VENTOLIN HFA) 108 (90 BASE) MCG/ACT inhaler Inhale 2 puffs into the lungs every 4 (four) hours as needed for wheezing.  1 Inhaler  0   No current facility-administered medications for this visit.    Past Medical History  Diagnosis Date  . Coronary artery disease 2012    PCI-LAD & LCX, Promus Des  . Hypertension   .  Myocardial infarction 05/23/2010    non-ST elevation MI  . Pulmonary embolus 05/23/2010  . Permanent atrial fibrillation   . Chronic anticoagulation 2011    on coumadin  . Pulmonary hypertension     mild to moderate  . Hyperlipidemia LDL goal < 70   . Echocardiogram abnormal 11/07/2010    EF 50-55%, LA mild to mod dilated, mod MR, RV SYSTOLIC PRESSure 40-50 mild-mod pul. HTN   . H/O cardiovascular stress test 11/07/2010    new scar in LCX distribution    Past Surgical History  Procedure Laterality Date  . Cardiac catheterization  05/23/10    high grade LAD and circumflex disease, EF 50% stented circumflex w/ Promus drug-eluding stent  . Cardiac catheterization  05/28/2010    LAD sten w/ Promus drug eluding stent    ZOX:WRUEAVW:UJ colds or fevers, no weight changes Skin:no rashes or ulcers HEENT:no blurred vision, no congestion CV:see  HPI PUL:see HPI GI:no diarrhea constipation or melena, no indigestion GU:no hematuria, no dysuria MS:no joint pain, no claudication, does have feet burning pains followed by Dr. Ricki Miller Neuro:no syncope, no lightheadedness Endo:no diabetes, no thyroid disease  PHYSICAL EXAM BP 140/80  Pulse 83  Ht 5\' 7"  (1.702 m)  Wt 168 lb (76.204 kg)  BMI 26.31 kg/m2 General:Pleasant affect, NAD Skin:Warm and dry, brisk capillary refill HEENT:normocephalic, sclera clear, mucus membranes moist Neck:supple, no JVD, no bruits  Heart:irreg irreg without murmur, gallup, rub or click Lungs:clear without rales, rhonchi, or wheezes WJX:BJYN, non tender, + BS, do not palpate liver spleen or masses Ext:no lower ext edema, 2+ pedal pulses, 2+ radial pulses Neuro:alert and oriented, MAE, follows commands, + facial symmetry  WGN:FAOZHY fib rate controlled rate 69, no acute changes from last tracing.  ASSESSMENT AND PLAN Coronary artery disease Hx. Of MI-PCI-LAD & LCX, Promus Des   Hyperlipidemia LDL goal < 70 We will check Lipid panel.  He will have drawn at Dr. Lynne Logan office  Permanent atrial fibrillation Rate controlled atrial fib, denies rapid HR.  Long term (current) use of anticoagulants Followed by Dr. Vedia Pereyra at Dr. Lynne Logan office. He reports it has been stable.

## 2013-03-29 NOTE — Assessment & Plan Note (Signed)
We will check Lipid panel.  He will have drawn at Dr. Lynne Logan office

## 2013-03-29 NOTE — Assessment & Plan Note (Signed)
Followed by Dr. Vedia Pereyra at Dr. Lynne Logan office. He reports it has been stable.

## 2013-03-29 NOTE — Assessment & Plan Note (Signed)
Hx. Of MI-PCI-LAD & LCX, Promus Des

## 2013-03-29 NOTE — Patient Instructions (Signed)
Have lab work done at Dr. Lynne Logan office tocheck cholesterol.  Do not eat or drink after midnight for the lab work.  Call if any chest pain or Shortness of breath.  See Dr. Allyson Sabal in 6 months.

## 2013-04-05 ENCOUNTER — Encounter: Payer: Self-pay | Admitting: Cardiovascular Disease

## 2013-07-27 ENCOUNTER — Other Ambulatory Visit (HOSPITAL_COMMUNITY): Payer: Self-pay

## 2013-09-16 ENCOUNTER — Other Ambulatory Visit: Payer: Self-pay | Admitting: Pharmacist Clinician (PhC)/ Clinical Pharmacy Specialist

## 2013-09-16 ENCOUNTER — Other Ambulatory Visit: Payer: Self-pay | Admitting: *Deleted

## 2013-09-16 NOTE — Telephone Encounter (Signed)
Opened in error

## 2013-09-27 ENCOUNTER — Ambulatory Visit (INDEPENDENT_AMBULATORY_CARE_PROVIDER_SITE_OTHER): Payer: Medicare Other | Admitting: Cardiovascular Disease

## 2013-09-27 ENCOUNTER — Encounter: Payer: Self-pay | Admitting: Cardiovascular Disease

## 2013-09-27 VITALS — BP 142/70 | HR 75 | Ht 67.0 in | Wt 162.0 lb

## 2013-09-27 DIAGNOSIS — E785 Hyperlipidemia, unspecified: Secondary | ICD-10-CM

## 2013-09-27 DIAGNOSIS — I4891 Unspecified atrial fibrillation: Secondary | ICD-10-CM

## 2013-09-27 DIAGNOSIS — I2510 Atherosclerotic heart disease of native coronary artery without angina pectoris: Secondary | ICD-10-CM

## 2013-09-27 DIAGNOSIS — I4821 Permanent atrial fibrillation: Secondary | ICD-10-CM

## 2013-09-27 DIAGNOSIS — I10 Essential (primary) hypertension: Secondary | ICD-10-CM

## 2013-09-27 NOTE — Assessment & Plan Note (Addendum)
History of known CAD status post non-ST segment elevation myocardial infarction in the past. He had staged intervention of his LAD and circumflex coronary arteries by Dr. Tresa EndoKelly January 2012 Promus drug-eluting stents. He denies chest pain or shortness of breath.

## 2013-09-27 NOTE — Assessment & Plan Note (Signed)
Rate controlled on Coumadin anticoagulation 

## 2013-09-27 NOTE — Assessment & Plan Note (Signed)
Well-controlled on current medications 

## 2013-09-27 NOTE — Assessment & Plan Note (Signed)
On statin therapy followed by his PCP 

## 2013-09-27 NOTE — Patient Instructions (Signed)
Dr Berry recommends that you schedule a follow-up appointment in 6 months with an extender - Laura Ingold, NP.  Dr Berry wants you to follow-up in 12 months. You will receive a reminder letter in the mail two months in advance. If you don't receive a letter, please call our office to schedule the follow-up appointment. 

## 2013-09-27 NOTE — Progress Notes (Signed)
09/27/2013 Troy [name]   Apr 05, 1921  [id number]  Primary Physician [name],[name], MD Primary Cardiologist: Runell GessJonathan J. Berry MD [name],[name],[name], [name]   HPI:  The patient is an 80 year old, mildly overweight, married Caucasian male, father of 2, grandfather to 2 grandchildren who is accompanied by his daughter today.unfortunately, his son apparently was found dead by the patient this past weekend thought to be related to an acute myocardial infarction. He was last seen in our office 6 months ago and was seen by Nada BoozerLaura Ingold registered nurse practitioner. I last saw him one year ago.Marland Kitchen. He has a history of CAD and has had a non-ST-segment-elevation myocardial infarction in the past. He had staged LAD and circumflex intervention by Dr. Tresa EndoKelly in January of last year with Promus drug-eluting stent to his proximal LAD. He also has a history of pulmonary emboli and chronic A-fib on Coumadin anticoagulation. His other problems include hypertension and hyperlipidemia. Last echo performed a year ago revealed normal LV systolic function with diastolic dysfunction and mild posterior wall and inferior wall hypokinesia. He did have mild to moderate pulmonary hypertension with moderate TR. A Myoview performed at the same time did show inferolateral scar. I saw him he denies chest pain or shortness of breath. He he is obviously grieving from the loss of his son.     Current Outpatient Prescriptions  Medication Sig Dispense Refill  . albuterol (PROVENTIL) (2.5 MG/3ML) 0.083% nebulizer solution Take 2.5 mg by nebulization every 6 (six) hours as needed for wheezing or shortness of breath.      Marland Kitchen. aspirin 81 MG EC tablet Take 81 mg by mouth daily.        Marland Kitchen. diltiazem (CARDIZEM CD) 120 MG 24 hr capsule Take 1 capsule (120 mg total) by mouth daily.  90 capsule  3  . finasteride (PROSCAR) 5 MG tablet Take 5 mg by mouth daily.       . furosemide (LASIX) 40 MG tablet Take 1 tablet (40 mg total) by mouth daily.  90 tablet   3  . latanoprost (XALATAN) 0.005 % ophthalmic solution Place 1 drop into both eyes at bedtime.      Marland Kitchen. lisinopril (PRINIVIL,ZESTRIL) 20 MG tablet Take 20 mg by mouth daily.        . metoprolol (LOPRESSOR) 50 MG tablet Take 1 tablet (50 mg total) by mouth 2 (two) times daily.  180 tablet  3  . pantoprazole (PROTONIX) 40 MG tablet Take 1 tablet (40 mg total) by mouth daily.  90 tablet  3  . pravastatin (PRAVACHOL) 40 MG tablet Take 40 mg by mouth daily.      . predniSONE (DELTASONE) 5 MG tablet Take 5 mg by mouth daily.        . Tamsulosin HCl (FLOMAX) 0.4 MG CAPS Take 0.4 mg by mouth at bedtime.        Marland Kitchen. warfarin (COUMADIN) 5 MG tablet Take 1 tablet by mouth daily  90 tablet  0  . albuterol (PROVENTIL HFA;VENTOLIN HFA) 108 (90 BASE) MCG/ACT inhaler Inhale 2 puffs into the lungs every 4 (four) hours as needed for wheezing.  1 Inhaler  0  . nitroGLYCERIN (NITROSTAT) 0.4 MG SL tablet Place 0.4 mg under the tongue every 5 (five) minutes as needed.       No current facility-administered medications for this visit.    No Known Allergies  History   Social History  . Marital Status: Married    Spouse Name: N/A    Number of  Children: 2  . Years of Education: N/A   Occupational History  .     Social History Main Topics  . Smoking status: Former Smoker    Quit date: 09/27/1973  . Smokeless tobacco: Not on file  . Alcohol Use: No  . Drug Use: No  . Sexual Activity: Not on file   Other Topics Concern  . Not on file   Social History Narrative  . No narrative on file     Review of Systems: General: negative for chills, fever, night sweats or weight changes.  Cardiovascular: negative for chest pain, dyspnea on exertion, edema, orthopnea, palpitations, paroxysmal nocturnal dyspnea or shortness of breath Dermatological: negative for rash Respiratory: negative for cough or wheezing Urologic: negative for hematuria Abdominal: negative for nausea, vomiting, diarrhea, bright red blood per  rectum, melena, or hematemesis Neurologic: negative for visual changes, syncope, or dizziness All other systems reviewed and are otherwise negative except as noted above.    Blood pressure 142/70, pulse 75, height 5\' 7"  (1.702 m), weight 162 lb (73.483 kg).  General appearance: alert and no distress Neck: no adenopathy, no carotid bruit, no JVD, supple, symmetrical, trachea midline and thyroid not enlarged, symmetric, no tenderness/mass/nodules Lungs: clear to auscultation bilaterally Heart: irregularly irregular rhythm Extremities: extremities normal, atraumatic, no cyanosis or edema  EKG atrial fibrillation with a ventricular response of 75  ASSESSMENT AND PLAN:   Coronary artery disease History of known CAD status post non-ST segment elevation myocardial infarction in the past. He had staged intervention of his LAD and circumflex coronary arteries by Dr. Tresa EndoKelly January 2012 Promus drug-eluting stents. He denies chest pain or shortness of breath.  Hyperlipidemia LDL goal < 70 On statin therapy followed by his PCP  Hypertension Well-controlled on current medications  Permanent atrial fibrillation Rate controlled on Coumadin anticoagulation      Runell GessJonathan J. Berry MD [facility] GermantownFACP,[name],[name], [facility] IncFSCAI 09/27/2013 11:02 AM

## 2013-12-05 ENCOUNTER — Other Ambulatory Visit: Payer: Self-pay | Admitting: Cardiovascular Disease

## 2013-12-05 NOTE — Telephone Encounter (Signed)
Rx was sent to pharmacy electronically. 

## 2014-05-26 ENCOUNTER — Other Ambulatory Visit: Payer: Self-pay | Admitting: Cardiovascular Disease

## 2014-06-06 DIAGNOSIS — Z7901 Long term (current) use of anticoagulants: Secondary | ICD-10-CM | POA: Diagnosis not present

## 2014-06-06 DIAGNOSIS — I4891 Unspecified atrial fibrillation: Secondary | ICD-10-CM | POA: Diagnosis not present

## 2014-06-27 DIAGNOSIS — I4891 Unspecified atrial fibrillation: Secondary | ICD-10-CM | POA: Diagnosis not present

## 2014-06-27 DIAGNOSIS — Z7901 Long term (current) use of anticoagulants: Secondary | ICD-10-CM | POA: Diagnosis not present

## 2014-07-17 DIAGNOSIS — M109 Gout, unspecified: Secondary | ICD-10-CM | POA: Diagnosis not present

## 2014-07-17 DIAGNOSIS — I2510 Atherosclerotic heart disease of native coronary artery without angina pectoris: Secondary | ICD-10-CM | POA: Diagnosis not present

## 2014-07-17 DIAGNOSIS — Z0000 Encounter for general adult medical examination without abnormal findings: Secondary | ICD-10-CM | POA: Diagnosis not present

## 2014-07-25 DIAGNOSIS — I4891 Unspecified atrial fibrillation: Secondary | ICD-10-CM | POA: Diagnosis not present

## 2014-07-25 DIAGNOSIS — Z7901 Long term (current) use of anticoagulants: Secondary | ICD-10-CM | POA: Diagnosis not present

## 2014-08-29 DIAGNOSIS — I4891 Unspecified atrial fibrillation: Secondary | ICD-10-CM | POA: Diagnosis not present

## 2014-08-29 DIAGNOSIS — Z7901 Long term (current) use of anticoagulants: Secondary | ICD-10-CM | POA: Diagnosis not present

## 2014-09-28 DIAGNOSIS — Z7901 Long term (current) use of anticoagulants: Secondary | ICD-10-CM | POA: Diagnosis not present

## 2014-09-28 DIAGNOSIS — I4891 Unspecified atrial fibrillation: Secondary | ICD-10-CM | POA: Diagnosis not present

## 2014-10-06 DIAGNOSIS — I2510 Atherosclerotic heart disease of native coronary artery without angina pectoris: Secondary | ICD-10-CM | POA: Diagnosis not present

## 2014-10-06 DIAGNOSIS — M109 Gout, unspecified: Secondary | ICD-10-CM | POA: Diagnosis not present

## 2014-10-16 ENCOUNTER — Other Ambulatory Visit: Payer: Self-pay | Admitting: Internal Medicine

## 2014-10-16 DIAGNOSIS — I480 Paroxysmal atrial fibrillation: Secondary | ICD-10-CM | POA: Diagnosis not present

## 2014-10-16 DIAGNOSIS — N189 Chronic kidney disease, unspecified: Secondary | ICD-10-CM

## 2014-10-16 DIAGNOSIS — E039 Hypothyroidism, unspecified: Secondary | ICD-10-CM | POA: Diagnosis not present

## 2014-10-16 DIAGNOSIS — M1A0790 Idiopathic chronic gout, unspecified ankle and foot, without tophus (tophi): Secondary | ICD-10-CM | POA: Diagnosis not present

## 2014-10-18 ENCOUNTER — Encounter: Payer: Self-pay | Admitting: Cardiovascular Disease

## 2014-10-18 ENCOUNTER — Ambulatory Visit
Admission: RE | Admit: 2014-10-18 | Discharge: 2014-10-18 | Disposition: A | Discharge status: Home / Self Care | Payer: Medicare Other | Source: Ambulatory Visit | Attending: Internal Medicine | Admitting: Internal Medicine

## 2014-10-18 DIAGNOSIS — N189 Chronic kidney disease, unspecified: Secondary | ICD-10-CM

## 2014-10-18 DIAGNOSIS — N281 Cyst of kidney, acquired: Secondary | ICD-10-CM | POA: Diagnosis not present

## 2014-10-26 DIAGNOSIS — Z7901 Long term (current) use of anticoagulants: Secondary | ICD-10-CM | POA: Diagnosis not present

## 2014-10-26 DIAGNOSIS — I4891 Unspecified atrial fibrillation: Secondary | ICD-10-CM | POA: Diagnosis not present

## 2014-12-03 ENCOUNTER — Other Ambulatory Visit: Payer: Self-pay | Admitting: Cardiovascular Disease

## 2014-12-04 NOTE — Telephone Encounter (Signed)
Rx(s) sent to pharmacy electronically.  

## 2015-02-06 ENCOUNTER — Other Ambulatory Visit: Payer: Self-pay | Admitting: Nephrology

## 2015-02-06 DIAGNOSIS — I10 Essential (primary) hypertension: Secondary | ICD-10-CM

## 2015-02-08 ENCOUNTER — Ambulatory Visit (HOSPITAL_COMMUNITY)
Admission: RE | Admit: 2015-02-08 | Discharge: 2015-02-08 | Disposition: A | Discharge status: Home / Self Care | Payer: Medicare Other | Source: Ambulatory Visit | Attending: Cardiology | Admitting: Cardiology

## 2015-02-08 DIAGNOSIS — I10 Essential (primary) hypertension: Secondary | ICD-10-CM | POA: Diagnosis present

## 2015-02-08 DIAGNOSIS — E785 Hyperlipidemia, unspecified: Secondary | ICD-10-CM | POA: Diagnosis not present

## 2015-02-08 DIAGNOSIS — F17200 Nicotine dependence, unspecified, uncomplicated: Secondary | ICD-10-CM | POA: Diagnosis not present

## 2015-02-08 DIAGNOSIS — I2510 Atherosclerotic heart disease of native coronary artery without angina pectoris: Secondary | ICD-10-CM | POA: Diagnosis not present

## 2015-03-04 ENCOUNTER — Other Ambulatory Visit: Payer: Self-pay | Admitting: Cardiovascular Disease

## 2015-03-05 NOTE — Telephone Encounter (Signed)
REFILL 

## 2015-03-06 ENCOUNTER — Other Ambulatory Visit: Payer: Self-pay | Admitting: Cardiovascular Disease

## 2015-03-06 NOTE — Telephone Encounter (Signed)
REFILL 

## 2015-03-27 ENCOUNTER — Ambulatory Visit (INDEPENDENT_AMBULATORY_CARE_PROVIDER_SITE_OTHER): Payer: Medicare Other | Admitting: Cardiovascular Disease

## 2015-03-27 ENCOUNTER — Encounter: Payer: Self-pay | Admitting: Cardiovascular Disease

## 2015-03-27 VITALS — BP 136/74 | HR 62 | Ht 67.0 in | Wt 160.0 lb

## 2015-03-27 DIAGNOSIS — I10 Essential (primary) hypertension: Secondary | ICD-10-CM

## 2015-03-27 DIAGNOSIS — I482 Chronic atrial fibrillation: Secondary | ICD-10-CM

## 2015-03-27 DIAGNOSIS — I2510 Atherosclerotic heart disease of native coronary artery without angina pectoris: Secondary | ICD-10-CM | POA: Diagnosis not present

## 2015-03-27 DIAGNOSIS — I2583 Coronary atherosclerosis due to lipid rich plaque: Secondary | ICD-10-CM

## 2015-03-27 DIAGNOSIS — I4821 Permanent atrial fibrillation: Secondary | ICD-10-CM

## 2015-03-27 NOTE — Patient Instructions (Signed)

## 2015-03-27 NOTE — Assessment & Plan Note (Signed)
History of hyperlipidemia on pravastatin followed by his PCP 

## 2015-03-27 NOTE — Assessment & Plan Note (Signed)
History of chronic atrial fibrillation rate controlled on Coumadin anticoagulation. 

## 2015-03-27 NOTE — Progress Notes (Signed)
03/27/2015 Troy [name]   Jan 02, 1921  [id number]  Primary Physician Troy [name],[name], MD Primary Cardiologist: [name] Troy RenoFACP,[name],[name], [name]   HPI:  The patient is an 81 year old, mildly overweight, married Caucasian male, father of 2, grandfather to 2 grandchildren who is accompanied by his granddaughter Gearldine BienenstockBrandy today.Unfortunately, his son apparently was found dead by the patient last May  thought to be related to an acute myocardial infarction. I last saw him in the office 09/27/13.Marland Kitchen. He has a history of CAD and has had a non-ST-segment-elevation myocardial infarction in the past. He had staged LAD and circumflex intervention by Dr. Tresa EndoKelly in January of last year with Promus drug-eluting stent to his proximal LAD. He also has a history of pulmonary emboli and chronic A-fib on Coumadin anticoagulation. His other problems include hypertension and hyperlipidemia. Last echo performed a year ago revealed normal LV systolic function with diastolic dysfunction and mild posterior wall and inferior wall hypokinesia. He did have mild to moderate pulmonary hypertension with moderate TR. A Myoview performed at the same time did show inferolateral scar. I saw him he denies chest pain or shortness of breath. Not only did he lose his son a year ago from an acute myocardial infarction but he also lost his wife of 65 years 04/06/14. He currently lives alone.    Current Outpatient Prescriptions  Medication Sig Dispense Refill  . aspirin 81 MG EC tablet Take 81 mg by mouth daily.      Marland Kitchen. diltiazem (CARDIZEM CD) 120 MG 24 hr capsule TAKE ONE CAPSULE BY MOUTH EVERY DAY 90 capsule 0  . finasteride (PROSCAR) 5 MG tablet Take 5 mg by mouth daily.     . furosemide (LASIX) 40 MG tablet Take 1 tablet (40 mg total) by mouth daily. PATIENT NEEDS TO CONTACT OFFICE FOR ADDITIONAL REFILLS (Patient taking differently: Take 40 mg by mouth daily. TAKE EVERY OTHER DAY.) 90 tablet 0  . latanoprost (XALATAN) 0.005 %  ophthalmic solution Place 1 drop into both eyes at bedtime.    Marland Kitchen. levothyroxine (SYNTHROID, LEVOTHROID) 50 MCG tablet Take 50 mcg by mouth daily.  4  . lisinopril (PRINIVIL,ZESTRIL) 20 MG tablet Take 10 mg by mouth daily.     . metoprolol (LOPRESSOR) 50 MG tablet TAKE 1 TABLET BY MOUTH TWICE A DAY 180 tablet 0  . nitroGLYCERIN (NITROSTAT) 0.4 MG SL tablet Place 0.4 mg under the tongue every 5 (five) minutes as needed.    . pantoprazole (PROTONIX) 40 MG tablet TAKE 1 TABLET BY MOUTH EVERY DAY 90 tablet 0  . pravastatin (PRAVACHOL) 40 MG tablet Take 40 mg by mouth daily.    . predniSONE (DELTASONE) 5 MG tablet Take 5 mg by mouth daily.      . Tamsulosin HCl (FLOMAX) 0.4 MG CAPS Take 0.4 mg by mouth at bedtime.      Marland Kitchen. ULORIC 40 MG tablet Take 40 mg by mouth daily.  12  . warfarin (COUMADIN) 5 MG tablet Take 1 tablet by mouth daily 90 tablet 0   No current facility-administered medications for this visit.    No Known Allergies  Social History   Social History  . Marital Status: Married    Spouse Name: N/A  . Number of Children: 2  . Years of Education: N/A   Occupational History  .     Social History Main Topics  . Smoking status: Former Smoker    Quit date: 09/27/1973  . Smokeless tobacco: Not on file  .  Alcohol Use: No  . Drug Use: No  . Sexual Activity: Not on file   Other Topics Concern  . Not on file   Social History Narrative     Review of Systems: General: negative for chills, fever, night sweats or weight changes.  Cardiovascular: negative for chest pain, dyspnea on exertion, edema, orthopnea, palpitations, paroxysmal nocturnal dyspnea or shortness of breath Dermatological: negative for rash Respiratory: negative for cough or wheezing Urologic: negative for hematuria Abdominal: negative for nausea, vomiting, diarrhea, bright red blood per rectum, melena, or hematemesis Neurologic: negative for visual changes, syncope, or dizziness All other systems reviewed and  are otherwise negative except as noted above.    Blood pressure 136/74, pulse 62, height  (1.702 m), weight 160 lb (72.576 kg).  General appearance: alert and no distress Neck: no adenopathy, no carotid bruit, no JVD, supple, symmetrical, trachea midline and thyroid not enlarged, symmetric, no tenderness/mass/nodules Lungs: clear to auscultation bilaterally Heart: regular rate and rhythm, S1, S2 normal, no murmur, click, rub or gallop Extremities: extremities normal, atraumatic, no cyanosis or edema  EKG atrial fibrillation with a ventricular response of 62, poor R-wave progression and low limb voltage. I personally reviewed this EKG  ASSESSMENT AND PLAN:   Permanent atrial fibrillation History of chronic atrial fibrillation rate controlled on Coumadin anticoagulation  Coronary artery disease History of coronary artery disease status post non-ST segment elevation myocardial infarction. He has stage LAD and circumflex intervention by Dr. Tresa Endo in January 2015 with a Promus drug eluting stent to his proximal LAD. A Myoview stress test performed in the past has shown inferior wall scar without ischemia. He denies chest pain or shortness of breath.  Hypertension History of hypertension blood pressure measured at 136/74. He is on diltiazem and lisinopril. Continue current meds at current dosing  Hyperlipidemia LDL goal < 70 History of hyperlipidemia on pravastatin followed by his PCP      Runell Gess MD [facility] [place], [facility] 03/27/2015 11:19 AM

## 2015-03-27 NOTE — Assessment & Plan Note (Signed)
History of hypertension blood pressure measured at 136/74. He is on diltiazem and lisinopril. Continue current meds at current dosing

## 2015-03-27 NOTE — Assessment & Plan Note (Signed)
History of coronary artery disease status post non-ST segment elevation myocardial infarction. He has stage LAD and circumflex intervention by Dr. Tresa EndoKelly in January 2015 with a Promus drug eluting stent to his proximal LAD. A Myoview stress test performed in the past has shown inferior wall scar without ischemia. He denies chest pain or shortness of breath.

## 2015-04-10 ENCOUNTER — Other Ambulatory Visit: Payer: Self-pay | Admitting: Cardiovascular Disease

## 2015-04-11 NOTE — Telephone Encounter (Signed)
Rx request sent to pharmacy.  

## 2015-05-08 ENCOUNTER — Other Ambulatory Visit (HOSPITAL_COMMUNITY): Payer: Self-pay | Admitting: *Deleted

## 2015-05-09 ENCOUNTER — Encounter (HOSPITAL_COMMUNITY): Payer: Medicare Other

## 2015-05-14 ENCOUNTER — Other Ambulatory Visit (HOSPITAL_COMMUNITY): Payer: Self-pay | Admitting: *Deleted

## 2015-05-15 ENCOUNTER — Ambulatory Visit (HOSPITAL_COMMUNITY)
Admission: RE | Admit: 2015-05-15 | Discharge: 2015-05-15 | Disposition: A | Discharge status: Home / Self Care | Payer: Medicare Other | Source: Ambulatory Visit | Attending: Nephrology | Admitting: Nephrology

## 2015-05-15 DIAGNOSIS — D509 Iron deficiency anemia, unspecified: Secondary | ICD-10-CM | POA: Insufficient documentation

## 2015-05-15 MED ORDER — SODIUM CHLORIDE 0.9 % IV SOLN
510.0000 mg | INTRAVENOUS | Status: DC
  Administered 2015-05-15: 510 mg via INTRAVENOUS
  Filled 2015-05-15: qty 17

## 2015-05-22 ENCOUNTER — Other Ambulatory Visit (HOSPITAL_COMMUNITY): Payer: Self-pay | Admitting: *Deleted

## 2015-05-23 ENCOUNTER — Encounter (HOSPITAL_COMMUNITY)
Admission: RE | Admit: 2015-05-23 | Discharge: 2015-05-23 | Disposition: A | Discharge status: Home / Self Care | Payer: Medicare Other | Source: Ambulatory Visit | Attending: Nephrology | Admitting: Nephrology

## 2015-05-23 DIAGNOSIS — D509 Iron deficiency anemia, unspecified: Secondary | ICD-10-CM | POA: Diagnosis present

## 2015-05-23 MED ORDER — SODIUM CHLORIDE 0.9 % IV SOLN
510.0000 mg | INTRAVENOUS | Status: AC
  Administered 2015-05-23: 510 mg via INTRAVENOUS
  Filled 2015-05-23: qty 17

## 2015-06-05 ENCOUNTER — Other Ambulatory Visit: Payer: Self-pay | Admitting: Cardiovascular Disease

## 2015-06-05 NOTE — Telephone Encounter (Signed)
Rx request sent to pharmacy.  

## 2015-08-09 ENCOUNTER — Encounter: Payer: Self-pay | Admitting: Cardiovascular Disease

## 2016-01-21 ENCOUNTER — Other Ambulatory Visit: Payer: Self-pay | Admitting: Cardiovascular Disease

## 2016-01-21 NOTE — Telephone Encounter (Signed)
Rx(s) sent to pharmacy electronically.  

## 2016-06-01 ENCOUNTER — Other Ambulatory Visit: Payer: Self-pay | Admitting: Cardiovascular Disease

## 2016-06-15 ENCOUNTER — Other Ambulatory Visit: Payer: Self-pay | Admitting: Cardiovascular Disease

## 2016-06-25 ENCOUNTER — Other Ambulatory Visit: Payer: Self-pay | Admitting: Cardiovascular Disease

## 2016-06-30 ENCOUNTER — Other Ambulatory Visit: Payer: Self-pay | Admitting: Cardiovascular Disease

## 2016-07-02 ENCOUNTER — Other Ambulatory Visit: Payer: Self-pay

## 2016-07-02 MED ORDER — FEBUXOSTAT 40 MG PO TABS: 40.0000 mg | ORAL_TABLET | Freq: Every day | ORAL | 0 refills | Status: DC

## 2016-07-13 ENCOUNTER — Other Ambulatory Visit: Payer: Self-pay | Admitting: Cardiovascular Disease

## 2016-07-14 NOTE — Telephone Encounter (Signed)
uloric refill refused. Non-cardiac medication. Patient has not been seen since 2016.

## 2016-07-18 ENCOUNTER — Telehealth: Payer: Self-pay | Admitting: Cardiovascular Disease

## 2016-07-18 MED ORDER — METOPROLOL TARTRATE 50 MG PO TABS: 50.0000 mg | ORAL_TABLET | Freq: Two times a day (BID) | ORAL | 0 refills | Status: DC

## 2016-07-18 NOTE — Telephone Encounter (Signed)
Rx(s) sent to pharmacy electronically - metoprolol tartrate Uloric refill deferred to PCP

## 2016-07-18 NOTE — Telephone Encounter (Signed)
New message     *STAT* If patient is at the pharmacy, call can be transferred to refill team.   1. Which medications need to be refilled? (please list name of each medication and dose if known) metoprolol 50 mg, uloric 40 mg  2. Which pharmacy/location (including street and city if local pharmacy) is medication to be sent to? [facility] [place] [phone]  3. Do they need a 30 day or 90 day supply? 30 day

## 2016-07-22 ENCOUNTER — Ambulatory Visit: Payer: Medicare Other | Admitting: Cardiovascular Disease

## 2016-07-28 ENCOUNTER — Other Ambulatory Visit: Payer: Self-pay | Admitting: Cardiovascular Disease

## 2016-07-29 ENCOUNTER — Other Ambulatory Visit: Payer: Self-pay | Admitting: Cardiovascular Disease

## 2016-08-01 ENCOUNTER — Ambulatory Visit (INDEPENDENT_AMBULATORY_CARE_PROVIDER_SITE_OTHER): Payer: Medicare Other | Admitting: Cardiovascular Disease

## 2016-08-01 ENCOUNTER — Encounter: Payer: Self-pay | Admitting: Cardiovascular Disease

## 2016-08-01 DIAGNOSIS — I10 Essential (primary) hypertension: Secondary | ICD-10-CM | POA: Diagnosis not present

## 2016-08-01 DIAGNOSIS — I482 Chronic atrial fibrillation: Secondary | ICD-10-CM | POA: Diagnosis not present

## 2016-08-01 DIAGNOSIS — I2510 Atherosclerotic heart disease of native coronary artery without angina pectoris: Secondary | ICD-10-CM | POA: Diagnosis not present

## 2016-08-01 DIAGNOSIS — I4821 Permanent atrial fibrillation: Secondary | ICD-10-CM

## 2016-08-01 NOTE — Progress Notes (Signed)
08/01/2016 Troy [name]   13-Jul-1920  [id number]  Primary Physician Juline Patch, MD Primary Cardiologist: Runell Gess MD Troy [name]  HPI:  The patient is an 83 year old, mildly overweight, married Caucasian male, father of 2, grandfather to 2 grandchildren who is accompanied by his daughter Troy [name] today... Unfortunately, his son apparently was found dead by the patient 11-May-2015thought to be related to an acute myocardial infarction. I last saw him in the office 03/27/15... He has a history of CAD and has had a non-ST-segment-elevation myocardial infarction in the past. He had staged LAD and circumflex intervention by Dr. Tresa Endo in January of last year with Promus drug-eluting stent to his proximal LAD. He also has a history of pulmonary emboli and chronic A-fib on Coumadin anticoagulation. His other problems include hypertension and hyperlipidemia. Last echo performed a year ago revealed normal LV systolic function with diastolic dysfunction and mild posterior wall and inferior wall hypokinesia. He did have mild to moderate pulmonary hypertension with moderate TR. A Myoview performed at the same time did show inferolateral scar. Not only did he lose his son a year ago from an acute myocardial infarction but he also lost his wife of 65 years 04/06/14. He currently lives alone. Since I saw him in the office he denies chest pain or shortness of breath. He did have a mechanical fall from loss of balance recently but this has been a rare phenomenon.  Current Outpatient Prescriptions  Medication Sig Dispense Refill  . aspirin 81 MG EC tablet Take 81 mg by mouth daily.      Marland Kitchen diltiazem (CARDIZEM CD) 120 MG 24 hr capsule Take 1 capsule (120 mg total) by mouth daily. 30 capsule 0  . febuxostat (ULORIC) 40 MG tablet Take 1 tablet (40 mg total) by mouth daily. Please contact office for additional refills 15 tablet 0  . finasteride (PROSCAR) 5 MG tablet Take 5 mg by mouth daily.     .  furosemide (LASIX) 40 MG tablet Take 1 tablet (40 mg total) by mouth daily. PATIENT NEEDS TO CONTACT OFFICE FOR ADDITIONAL REFILLS (Patient taking differently: Take 40 mg by mouth daily. TAKE EVERY OTHER DAY.) 90 tablet 0  . latanoprost (XALATAN) 0.005 % ophthalmic solution Place 1 drop into both eyes at bedtime.    Marland Kitchen levothyroxine (SYNTHROID, LEVOTHROID) 50 MCG tablet Take 50 mcg by mouth daily.  4  . lisinopril (PRINIVIL,ZESTRIL) 20 MG tablet Take 10 mg by mouth daily.     . metoprolol (LOPRESSOR) 50 MG tablet Take 1 tablet (50 mg total) by mouth 2 (two) times daily. 60 tablet 0  . nitroGLYCERIN (NITROSTAT) 0.4 MG SL tablet Place 0.4 mg under the tongue every 5 (five) minutes as needed.    . pantoprazole (PROTONIX) 40 MG tablet Take 1 tablet (40 mg total) by mouth daily. 30 tablet 0  . pravastatin (PRAVACHOL) 40 MG tablet Take 40 mg by mouth daily.    . predniSONE (DELTASONE) 5 MG tablet Take 5 mg by mouth daily.      . Tamsulosin HCl (FLOMAX) 0.4 MG CAPS Take 0.4 mg by mouth at bedtime.      Marland Kitchen warfarin (COUMADIN) 5 MG tablet Take 1 tablet by mouth daily 90 tablet 0   No current facility-administered medications for this visit.     No Known Allergies  Social History   Social History  . Marital status: Married    Spouse name: N/A  . Number of children: 2  .  Years of education: N/A   Occupational History  .  Retired   Social History Main Topics  . Smoking status: Former Smoker    Quit date: 09/27/1973  . Smokeless tobacco: Never Used  . Alcohol use No  . Drug use: No  . Sexual activity: Not on file   Other Topics Concern  . Not on file   Social History Narrative  . No narrative on file     Review of Systems: General: negative for chills, fever, night sweats or weight changes.  Cardiovascular: negative for chest pain, dyspnea on exertion, edema, orthopnea, palpitations, paroxysmal nocturnal dyspnea or shortness of breath Dermatological: negative for rash Respiratory:  negative for cough or wheezing Urologic: negative for hematuria Abdominal: negative for nausea, vomiting, diarrhea, bright red blood per rectum, melena, or hematemesis Neurologic: negative for visual changes, syncope, or dizziness All other systems reviewed and are otherwise negative except as noted above.    Blood pressure 122/62, pulse 88, height 5\' 7"  (1.702 m), weight 155 lb 9.6 oz (70.6 kg).  General appearance: alert and no distress Neck: no adenopathy, no carotid bruit, no JVD, supple, symmetrical, trachea midline and thyroid not enlarged, symmetric, no tenderness/mass/nodules Lungs: clear to auscultation bilaterally Heart: irregularly irregular rhythm Extremities: extremities normal, atraumatic, no cyanosis or edema  EKG atrial fib ablation with ventricular response of 88, low limb voltage, right axis deviation and septal Q waves. I personally reviewed this EKG.  ASSESSMENT AND PLAN:   Permanent atrial fibrillation History of permanent atrial fibrillation rate controlled on Coumadin anticoagulation followed by his PCP.  Coronary artery disease History of coronary artery disease status post non-ST segment elevation myocardial infarction in the past with staged LAD and circumflex intervention by Dr. Tresa EndoKelly in January 2015 with Promus drug-eluting stents. He denies chest pain or shortness of breath.  Hypertension History of hypertension blood pressure measured at 122/62. He is on metoprolol, diltiazem and lisinopril. Continue current meds are current dose  Hyperlipidemia LDL goal < 70 History of hyperlipidemia on statin therapy followed by his PCP      Runell GessJonathan J. Iram Astorino MD [facility]FACP,[name],[name], [facility] LPFSCAI 08/01/2016 9:02 AM

## 2016-08-01 NOTE — Assessment & Plan Note (Signed)
History of permanent atrial fibrillation rate controlled on Coumadin anticoagulation followed by his PCP.

## 2016-08-01 NOTE — Assessment & Plan Note (Signed)
History of coronary artery disease status post non-ST segment elevation myocardial infarction in the past with staged LAD and circumflex intervention by Dr. Tresa EndoKelly in January 2015 with Promus drug-eluting stents. He denies chest pain or shortness of breath.

## 2016-08-01 NOTE — Assessment & Plan Note (Signed)
History of hyperlipidemia on statin therapy followed by his PCP 

## 2016-08-01 NOTE — Assessment & Plan Note (Signed)
History of hypertension blood pressure measured at 122/62. He is on metoprolol, diltiazem and lisinopril. Continue current meds are current dose

## 2016-08-01 NOTE — Patient Instructions (Signed)

## 2016-08-04 ENCOUNTER — Other Ambulatory Visit: Payer: Self-pay | Admitting: Cardiovascular Disease

## 2016-08-06 ENCOUNTER — Inpatient Hospital Stay (HOSPITAL_BASED_OUTPATIENT_CLINIC_OR_DEPARTMENT_OTHER)
Admission: EM | Admit: 2016-08-06 | Discharge: 2016-08-14 | DRG: 444 | Disposition: A | Discharge status: Home Health | Payer: Medicare Other | Attending: Internal Medicine | Admitting: Internal Medicine

## 2016-08-06 ENCOUNTER — Emergency Department (HOSPITAL_BASED_OUTPATIENT_CLINIC_OR_DEPARTMENT_OTHER): Payer: Medicare Other

## 2016-08-06 ENCOUNTER — Encounter (HOSPITAL_BASED_OUTPATIENT_CLINIC_OR_DEPARTMENT_OTHER): Payer: Self-pay

## 2016-08-06 DIAGNOSIS — J181 Lobar pneumonia, unspecified organism: Secondary | ICD-10-CM | POA: Diagnosis not present

## 2016-08-06 DIAGNOSIS — R310 Gross hematuria: Secondary | ICD-10-CM | POA: Diagnosis present

## 2016-08-06 DIAGNOSIS — Z86711 Personal history of pulmonary embolism: Secondary | ICD-10-CM | POA: Diagnosis not present

## 2016-08-06 DIAGNOSIS — K8012 Calculus of gallbladder with acute and chronic cholecystitis without obstruction: Secondary | ICD-10-CM | POA: Diagnosis present

## 2016-08-06 DIAGNOSIS — N401 Enlarged prostate with lower urinary tract symptoms: Secondary | ICD-10-CM | POA: Diagnosis present

## 2016-08-06 DIAGNOSIS — I5033 Acute on chronic diastolic (congestive) heart failure: Secondary | ICD-10-CM | POA: Diagnosis present

## 2016-08-06 DIAGNOSIS — Y92239 Unspecified place in hospital as the place of occurrence of the external cause: Secondary | ICD-10-CM | POA: Diagnosis not present

## 2016-08-06 DIAGNOSIS — Z7952 Long term (current) use of systemic steroids: Secondary | ICD-10-CM | POA: Diagnosis not present

## 2016-08-06 DIAGNOSIS — E039 Hypothyroidism, unspecified: Secondary | ICD-10-CM | POA: Diagnosis present

## 2016-08-06 DIAGNOSIS — Y95 Nosocomial condition: Secondary | ICD-10-CM | POA: Diagnosis present

## 2016-08-06 DIAGNOSIS — N138 Other obstructive and reflux uropathy: Secondary | ICD-10-CM | POA: Diagnosis present

## 2016-08-06 DIAGNOSIS — R112 Nausea with vomiting, unspecified: Secondary | ICD-10-CM

## 2016-08-06 DIAGNOSIS — I2510 Atherosclerotic heart disease of native coronary artery without angina pectoris: Secondary | ICD-10-CM | POA: Diagnosis present

## 2016-08-06 DIAGNOSIS — I10 Essential (primary) hypertension: Secondary | ICD-10-CM | POA: Diagnosis present

## 2016-08-06 DIAGNOSIS — N184 Chronic kidney disease, stage 4 (severe): Secondary | ICD-10-CM | POA: Diagnosis present

## 2016-08-06 DIAGNOSIS — T380X5A Adverse effect of glucocorticoids and synthetic analogues, initial encounter: Secondary | ICD-10-CM | POA: Diagnosis not present

## 2016-08-06 DIAGNOSIS — F0390 Unspecified dementia without behavioral disturbance: Secondary | ICD-10-CM | POA: Diagnosis present

## 2016-08-06 DIAGNOSIS — D649 Anemia, unspecified: Secondary | ICD-10-CM | POA: Diagnosis present

## 2016-08-06 DIAGNOSIS — Q619 Cystic kidney disease, unspecified: Secondary | ICD-10-CM | POA: Diagnosis not present

## 2016-08-06 DIAGNOSIS — R05 Cough: Secondary | ICD-10-CM | POA: Diagnosis not present

## 2016-08-06 DIAGNOSIS — R339 Retention of urine, unspecified: Secondary | ICD-10-CM

## 2016-08-06 DIAGNOSIS — R1030 Lower abdominal pain, unspecified: Secondary | ICD-10-CM | POA: Diagnosis not present

## 2016-08-06 DIAGNOSIS — J90 Pleural effusion, not elsewhere classified: Secondary | ICD-10-CM | POA: Diagnosis not present

## 2016-08-06 DIAGNOSIS — I252 Old myocardial infarction: Secondary | ICD-10-CM | POA: Diagnosis not present

## 2016-08-06 DIAGNOSIS — I482 Chronic atrial fibrillation: Secondary | ICD-10-CM | POA: Diagnosis present

## 2016-08-06 DIAGNOSIS — I4821 Permanent atrial fibrillation: Secondary | ICD-10-CM | POA: Diagnosis present

## 2016-08-06 DIAGNOSIS — R109 Unspecified abdominal pain: Secondary | ICD-10-CM

## 2016-08-06 DIAGNOSIS — J189 Pneumonia, unspecified organism: Secondary | ICD-10-CM | POA: Diagnosis present

## 2016-08-06 DIAGNOSIS — I130 Hypertensive heart and chronic kidney disease with heart failure and stage 1 through stage 4 chronic kidney disease, or unspecified chronic kidney disease: Secondary | ICD-10-CM | POA: Diagnosis present

## 2016-08-06 DIAGNOSIS — E785 Hyperlipidemia, unspecified: Secondary | ICD-10-CM | POA: Diagnosis present

## 2016-08-06 DIAGNOSIS — Z955 Presence of coronary angioplasty implant and graft: Secondary | ICD-10-CM

## 2016-08-06 DIAGNOSIS — R791 Abnormal coagulation profile: Secondary | ICD-10-CM | POA: Diagnosis present

## 2016-08-06 DIAGNOSIS — R1011 Right upper quadrant pain: Secondary | ICD-10-CM

## 2016-08-06 DIAGNOSIS — K810 Acute cholecystitis: Secondary | ICD-10-CM

## 2016-08-06 DIAGNOSIS — N179 Acute kidney failure, unspecified: Secondary | ICD-10-CM | POA: Diagnosis present

## 2016-08-06 DIAGNOSIS — Z5320 Procedure and treatment not carried out because of patient's decision for unspecified reasons: Secondary | ICD-10-CM | POA: Diagnosis present

## 2016-08-06 DIAGNOSIS — N400 Enlarged prostate without lower urinary tract symptoms: Secondary | ICD-10-CM

## 2016-08-06 DIAGNOSIS — E8779 Other fluid overload: Secondary | ICD-10-CM | POA: Diagnosis not present

## 2016-08-06 DIAGNOSIS — R739 Hyperglycemia, unspecified: Secondary | ICD-10-CM | POA: Diagnosis present

## 2016-08-06 DIAGNOSIS — Z87891 Personal history of nicotine dependence: Secondary | ICD-10-CM | POA: Diagnosis not present

## 2016-08-06 DIAGNOSIS — R0602 Shortness of breath: Secondary | ICD-10-CM | POA: Diagnosis not present

## 2016-08-06 DIAGNOSIS — K811 Chronic cholecystitis: Secondary | ICD-10-CM | POA: Diagnosis not present

## 2016-08-06 DIAGNOSIS — I272 Pulmonary hypertension, unspecified: Secondary | ICD-10-CM | POA: Diagnosis present

## 2016-08-06 DIAGNOSIS — R059 Cough, unspecified: Secondary | ICD-10-CM

## 2016-08-06 DIAGNOSIS — Z7901 Long term (current) use of anticoagulants: Secondary | ICD-10-CM

## 2016-08-06 DIAGNOSIS — Z79899 Other long term (current) drug therapy: Secondary | ICD-10-CM

## 2016-08-06 LAB — COMPREHENSIVE METABOLIC PANEL
ALT: 52 U/L (ref 17–63)
AST: 66 U/L — ABNORMAL HIGH (ref 15–41)
Albumin: 3.4 g/dL — ABNORMAL LOW (ref 3.5–5.0)
Alkaline Phosphatase: 123 U/L (ref 38–126)
Anion gap: 10 (ref 5–15)
BUN: 57 mg/dL — AB (ref 6–20)
CHLORIDE: 98 mmol/L — AB (ref 101–111)
CO2: 26 mmol/L (ref 22–32)
CREATININE: 3.13 mg/dL — AB (ref 0.61–1.24)
Calcium: 8.6 mg/dL — ABNORMAL LOW (ref 8.9–10.3)
GFR calc Af Amer: 18 mL/min — ABNORMAL LOW (ref >60)
GFR, EST NON AFRICAN AMERICAN: 16 mL/min — AB (ref >60)
Glucose, Bld: 181 mg/dL — ABNORMAL HIGH (ref 65–99)
Potassium: 3.6 mmol/L (ref 3.5–5.1)
Sodium: 134 mmol/L — ABNORMAL LOW (ref 135–145)
Total Bilirubin: 3.9 mg/dL — ABNORMAL HIGH (ref 0.3–1.2)
Total Protein: 7.2 g/dL (ref 6.5–8.1)

## 2016-08-06 LAB — CBC WITH DIFFERENTIAL/PLATELET
BASOS PCT: 0 %
Basophils Absolute: 0 10*3/uL (ref 0.0–0.1)
EOS PCT: 0 %
Eosinophils Absolute: 0 10*3/uL (ref 0.0–0.7)
HEMATOCRIT: 32.3 % — AB (ref 39.0–52.0)
Hemoglobin: 10.3 g/dL — ABNORMAL LOW (ref 13.0–17.0)
LYMPHS ABS: 0.6 10*3/uL — AB (ref 0.7–4.0)
Lymphocytes Relative: 7 %
MCH: 27.9 pg (ref 26.0–34.0)
MCHC: 31.9 g/dL (ref 30.0–36.0)
MCV: 87.5 fL (ref 78.0–100.0)
MONO ABS: 0.9 10*3/uL (ref 0.1–1.0)
Monocytes Relative: 10 %
Neutro Abs: 7.3 10*3/uL (ref 1.7–7.7)
Neutrophils Relative %: 83 %
Platelets: 145 10*3/uL — ABNORMAL LOW (ref 150–400)
RBC: 3.69 MIL/uL — ABNORMAL LOW (ref 4.22–5.81)
RDW: 14.9 % (ref 11.5–15.5)
WBC: 8.8 10*3/uL (ref 4.0–10.5)

## 2016-08-06 LAB — URINALYSIS, ROUTINE W REFLEX MICROSCOPIC
Glucose, UA: NEGATIVE mg/dL
Ketones, ur: NEGATIVE mg/dL
Leukocytes, UA: NEGATIVE
NITRITE: NEGATIVE
Protein, ur: 30 mg/dL — AB
SPECIFIC GRAVITY, URINE: 1.012 (ref 1.005–1.030)
pH: 5.5 (ref 5.0–8.0)

## 2016-08-06 LAB — URINALYSIS, MICROSCOPIC (REFLEX): WBC UA: NONE SEEN WBC/hpf (ref 0–5)

## 2016-08-06 LAB — PROTIME-INR
INR: 4.34
Prothrombin Time: 42.7 seconds — ABNORMAL HIGH (ref 11.4–15.2)

## 2016-08-06 MED ORDER — SODIUM CHLORIDE 0.9 % IV SOLN
Freq: Once | INTRAVENOUS | Status: AC
  Administered 2016-08-06: 22:00:00 via INTRAVENOUS

## 2016-08-06 MED ORDER — PIPERACILLIN-TAZOBACTAM 3.375 G IVPB 30 MIN
3.3750 g | Freq: Once | INTRAVENOUS | Status: AC
  Administered 2016-08-06: 3.375 g via INTRAVENOUS
  Filled 2016-08-06 (×2): qty 50

## 2016-08-06 NOTE — ED Notes (Signed)
I asked Pt twice that we would like to have him stand up and try to urinate, Pt responded twice stating he is unable to urinate. RN Asencion Islam & RN Rosalita Chessman informed.

## 2016-08-06 NOTE — ED Provider Notes (Signed)
[facility] Provider Note   CSN: [id number] Arrival date & time: 08/06/16  1804 By signing my name below, I, Bridgette Habermann, attest that this documentation has been prepared under the direction and in the presence of Everlene Farrier, PA-C. Electronically Signed: Bridgette Habermann, ED Scribe. 08/06/16. 6:30 PM.  History   Chief Complaint Chief Complaint  Patient presents with  . Abdominal Pain   Level V caveat: Dementia   HPI The history is provided by the patient and a relative. No language interpreter was used.   HPI Comments:  Troy [name] is a 83 y.o. male with h/o CAD, MI, HTN, HLD, PE, and A-fib, who presents to the Emergency Department complaining of lower abdominal pain beginning two weeks ago with associated nausea and vomiting. He also notes he has had trouble urinating beginning 2 days ago. He reports he cannot urinate currently.  Pt's last bowel movement was today and he notes it was normal. Pt has h/o urinary retention, noting he's been here several times and has had to have a foley catheter placed. 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  Pt is on Coumadin. Pt denies fever, chills, diarrhea, testicular pain, cough, or any other associated symptoms.   PCP: Pearson Grippe, MD  Cardiologist: Nanetta Batty, MD  Past Medical History:  Diagnosis Date  . Chronic anticoagulation 2011   on coumadin  . Coronary artery disease 2012   PCI-LAD & LCX, Promus Des  . Echocardiogram abnormal 11/07/2010   EF 50-55%, LA mild to mod dilated, mod MR, RV SYSTOLIC PRESSure 40-50 mild-mod pul. HTN   . H/O cardiovascular stress test 11/07/2010   new scar in LCX distribution  . Hyperlipidemia LDL goal < 70   . Hypertension   . Myocardial infarction 05/23/2010   non-ST elevation MI  . Permanent atrial fibrillation (HCC)   . Pulmonary embolus (HCC) 05/23/2010  . Pulmonary hypertension    mild to moderate    Patient Active Problem List   Diagnosis Date Noted  .  Abdominal pain 08/06/2016  . Permanent atrial fibrillation (HCC) 03/28/2013  . Pulmonary embolus, 04/2010 03/28/2013  . Coronary artery disease   . Hypertension   . Hyperlipidemia LDL goal < 70   . Long term (current) use of anticoagulants 08/10/2012  . Myocardial infarction 05/23/2010    Past Surgical History:  Procedure Laterality Date  . CARDIAC CATHETERIZATION  05/23/10   high grade LAD and circumflex disease, EF 50% stented circumflex w/ Promus drug-eluding stent  . CARDIAC CATHETERIZATION  05/28/2010   LAD sten w/ Promus drug eluding stent  . CORONARY ANGIOPLASTY WITH STENT PLACEMENT         Home Medications    Prior to Admission medications   Medication Sig Start Date End Date Taking? Authorizing Provider  aspirin 81 MG EC tablet Take 81 mg by mouth daily.      Historical Provider, MD  diltiazem (CARDIZEM CD) 120 MG 24 hr capsule Take 1 capsule (120 mg total) by mouth daily. 07/29/16   Runell Gess, MD  finasteride (PROSCAR) 5 MG tablet Take 5 mg by mouth daily.  08/05/13   Historical Provider, MD  furosemide (LASIX) 40 MG tablet Take 1 tablet (40 mg total) by mouth daily. PATIENT NEEDS TO CONTACT OFFICE FOR ADDITIONAL REFILLS Patient taking differently: Take 40 mg by mouth daily. TAKE EVERY OTHER DAY. 12/04/14   Runell Gess, MD  latanoprost (XALATAN) 0.005 % ophthalmic solution Place 1 drop into both  eyes at bedtime.    Historical Provider, MD  levothyroxine (SYNTHROID, LEVOTHROID) 50 MCG tablet Take 50 mcg by mouth daily. 02/16/15   Historical Provider, MD  lisinopril (PRINIVIL,ZESTRIL) 20 MG tablet Take 10 mg by mouth daily.     Historical Provider, MD  metoprolol (LOPRESSOR) 50 MG tablet Take 1 tablet (50 mg total) by mouth 2 (two) times daily. 07/18/16   Runell Gess, MD  nitroGLYCERIN (NITROSTAT) 0.4 MG SL tablet Place 0.4 mg under the tongue every 5 (five) minutes as needed.    Historical Provider, MD  pantoprazole (PROTONIX) 40 MG tablet Take 1 tablet (40 mg  total) by mouth daily. 07/29/16   Runell Gess, MD  pravastatin (PRAVACHOL) 40 MG tablet Take 40 mg by mouth daily. 02/07/13   Historical Provider, MD  predniSONE (DELTASONE) 5 MG tablet Take 5 mg by mouth daily.      Historical Provider, MD  Tamsulosin HCl (FLOMAX) 0.4 MG CAPS Take 0.4 mg by mouth at bedtime.      Historical Provider, MD  ULORIC 40 MG tablet TAKE 1 TABLET BY MOUTH EVERY DAY 08/04/16   Runell Gess, MD  warfarin (COUMADIN) 5 MG tablet Take 1 tablet by mouth daily 12/10/12   Rosalee Kaufman, RPH-CPP    Family History No family history on file.  Social History Social History  Substance Use Topics  . Smoking status: Former Smoker    Quit date: 09/27/1973  . Smokeless tobacco: Never Used  . Alcohol use No     Allergies   Patient has no known allergies.   Review of Systems Review of Systems  Unable to perform ROS: Dementia  Constitutional: Negative for chills and fever.  Respiratory: Negative for cough.   Gastrointestinal: Positive for abdominal pain, nausea and vomiting. Negative for diarrhea.  Genitourinary: Positive for decreased urine volume and difficulty urinating. Negative for hematuria, penile pain and testicular pain.  Musculoskeletal: Negative for back pain.  Skin: Negative for rash.   Physical Exam Updated Vital Signs BP 127/74   Pulse 107   Temp 97.9 F (36.6 C) (Oral)   Resp 21   Ht 5\' 8"  (1.727 m)   Wt 70.3 kg   SpO2 93%   BMI 23.57 kg/m   Physical Exam  Constitutional: He appears well-developed and well-nourished. No distress.  Nontoxic appearing.  HENT:  Head: Normocephalic and atraumatic.  Mouth/Throat: Oropharynx is clear and moist.  Eyes: Conjunctivae are normal. Pupils are equal, round, and reactive to light. Right eye exhibits no discharge. Left eye exhibits no discharge.  Neck: Neck supple.  Cardiovascular: Normal rate, normal heart sounds and intact distal pulses.  An irregularly irregular rhythm present. Exam reveals no  gallop and no friction rub.   No murmur heard. Heart rate is irregularly irregular, A-fib on the monitor.  Pulmonary/Chest: Effort normal and breath sounds normal. No respiratory distress. He has no wheezes. He has no rales.  Abdominal: Soft. Bowel sounds are normal. He exhibits no distension. There is tenderness. There is no rebound and no guarding. No hernia.  Abdomen is soft. Bowel sounds are present. Suprapubic tenderness to palpation.  Genitourinary: Penis normal. Right testis shows no tenderness. Left testis shows no tenderness. Uncircumcised. No penile tenderness. No discharge found.  Genitourinary Comments: Male RN present for chaperone. Pt is uncircumcised, no penile or testicular tenderness to palpation or rashes. No penile discharge.  Musculoskeletal: He exhibits no edema.  Lymphadenopathy:    He has no cervical adenopathy.  Neurological: He  is alert. Coordination normal.  Skin: Skin is warm and dry. Capillary refill takes less than 2 seconds. No rash noted. He is not diaphoretic. No erythema. No pallor.  Psychiatric: He has a normal mood and affect. His behavior is normal.  Nursing note and vitals reviewed.    ED Treatments / Results  DIAGNOSTIC STUDIES: Oxygen Saturation is 96% on RA, adequate by my interpretation.    COORDINATION OF CARE: 6:29 PM Discussed treatment plan with pt at bedside and pt agreed to plan.  Labs (all labs ordered are listed, but only abnormal results are displayed) Labs Reviewed  COMPREHENSIVE METABOLIC PANEL - Abnormal; Notable for the following:       Result Value   Sodium 134 (*)    Chloride 98 (*)    Glucose, Bld 181 (*)    BUN 57 (*)    Creatinine, Ser 3.13 (*)    Calcium 8.6 (*)    Albumin 3.4 (*)    AST 66 (*)    Total Bilirubin 3.9 (*)    GFR calc non Af Amer 16 (*)    GFR calc Af Amer 18 (*)    All other components within normal limits  CBC WITH DIFFERENTIAL/PLATELET - Abnormal; Notable for the following:    RBC 3.69 (*)     Hemoglobin 10.3 (*)    HCT 32.3 (*)    Platelets 145 (*)    Lymphs Abs 0.6 (*)    All other components within normal limits  PROTIME-INR - Abnormal; Notable for the following:    Prothrombin Time 42.7 (*)    INR 4.34 (*)    All other components within normal limits  URINALYSIS, ROUTINE W REFLEX MICROSCOPIC - Abnormal; Notable for the following:    Color, Urine AMBER (*)    APPearance CLOUDY (*)    Hgb urine dipstick SMALL (*)    Bilirubin Urine SMALL (*)    Protein, ur 30 (*)    All other components within normal limits  URINALYSIS, MICROSCOPIC (REFLEX) - Abnormal; Notable for the following:    Bacteria, UA RARE (*)    Squamous Epithelial / LPF 0-5 (*)    All other components within normal limits    EKG  EKG Interpretation None       Radiology Ct Abdomen Pelvis Wo Contrast  Result Date: 08/06/2016 CLINICAL DATA:  Abdominal pain and bloating. Decreased urine output back. EXAM: CT ABDOMEN AND PELVIS WITHOUT CONTRAST TECHNIQUE: Multidetector CT imaging of the abdomen and pelvis was performed following the standard protocol without IV contrast. COMPARISON:  Renal ultrasound 10/18/2014. FINDINGS: Lower chest: A small right pleural effusion is present. Right greater than left lower lobe airspace disease is present. The heart is enlarged. Coronary artery calcifications are present. No significant pericardial effusion is present. Hepatobiliary: The liver is somewhat irregular suggesting cirrhosis. No discrete lesions are present. The common bile duct is within normal limits. There is wall thickening and inflammatory change about the gallbladder suggesting acute cholecystitis. There is noted free air or fluid. Pancreas: Within normal limits. Spleen: Normal in size without focal abnormality. Adrenals/Urinary Tract: The adrenal glands are normal bilaterally. Bilateral renal cystic disease is again noted. There is no stone or mass lesion. The central sinus cyst is present on the right. The  ureters are within normal limits bilaterally. A Foley catheter is present within the urinary bladder. Stomach/Bowel: The stomach is within normal limits. The duodenum is unremarkable. Small bowel is within normal limits. Inflammatory changes are present about the  ascending colon the appendix is visualized and normal. The transverse colon is unremarkable. The descending colon demonstrates diverticula without focal inflammation to suggest diverticulitis. Additional diverticular present throughout the sigmoid colon without focal inflammation. Vascular/Lymphatic: Atherosclerotic calcifications are present in the aorta and branch vessels without aneurysm. No significant adenopathy is present. Reproductive: Calcifications are present within the prostate gland. The prostate gland is markedly enlarged at 7.1 cm in transverse diameter. Other: No free air free fluid is present. Musculoskeletal: Multilevel facet degenerative changes are present. Slight degenerate anterolisthesis is present at L4-5. Leftward curvature of the lumbar spine is centered at L1. There is rightward curvature at L5. Pelvis is intact. The hips are located. IMPRESSION: 1. Wall thickening and diffuse inflammatory changes about the gallbladder suggesting acute cholecystitis. 2. Inflammatory changes about the ascending colon likely represent secondary reactive change without focal colitis. 3. Sigmoid diverticulosis without diverticulitis. 4. Extensive atherosclerotic change without aneurysm. 5. Marked dilation of the prostate gland. 6. Cardiomegaly. 7. Small right pleural effusion. 8. Mild irregularity of the liver suggesting cirrhosis. 9. Bilateral renal cystic disease. Electronically Signed   By: Marin Roberts M.D.   On: 08/06/2016 20:25    Procedures Procedures (including critical care time)  Medications Ordered in ED Medications  0.9 %  sodium chloride infusion ( Intravenous New Bag/Given 08/06/16 2213)  piperacillin-tazobactam (ZOSYN)  IVPB 3.375 g (0 g Intravenous Stopped 08/07/16 0021)     Initial Impression / Assessment and Plan / ED Course  I have reviewed the triage vital signs and the nursing notes.  Pertinent labs & imaging results that were available during my care of the patient were reviewed by me and considered in my medical decision making (see chart for details).    This is a 83 y.o. male with h/o CAD, MI, HTN, HLD, PE, and A-fib, on coumadin who presents to the Emergency Department complaining of lower abdominal pain beginning two weeks ago with associated nausea and vomiting. He also notes he has had trouble urinating beginning 2 days ago. He reports he cannot urinate currently.  Pt's last bowel movement was today and he notes it was normal. Pt has h/o urinary retention. On exam patient is afebrile nontoxic appearing. His abdomen is soft and has superpubic abdominal tenderness to palpation. GU exam is unremarkable. Patient is unable to urinate for [place] in the emergency department. Foley catheter was placed in a large amount of urine was obtained. Patient reports that relief of his lower abdominal pain after Foley catheter was placed. Urinalysis shows no sign of infection. CMP is remarkable for an acute kidney injury with a creatinine of 3.13. BUN is 57. I suspect this is related to his urinary retention today. Patient's AST is also mildly elevated at 66 with elevated total bilirubin of 3.9. INR is supratherapeutic at 4.34. CT abdomen and pelvis shows findings to suggest acute cholecystitis. This follows of this patient's vomiting and nausea.  I consulted with general surgeon Dr. Maisie Fus who would like the patient admitted to medicine and they will consult on the patient in the morning.  I spoke with triad hospitalist Dr. Toniann Fail who accepted the patient for admission to [facility]. Patient and family agree with plan for admission. Patient to be transferred by [facility] to [facility] for  admission.  This patient was discussed with and evaluated by Dr. Ranae Palms who agrees with assessment and plan.   Final Clinical Impressions(s) / ED Diagnoses   Final diagnoses:  AKI (acute kidney injury) (HCC)  Urinary retention  Acute cholecystitis  Non-intractable vomiting with nausea, unspecified vomiting type  Prostate enlargement  Elevated INR    New Prescriptions New Prescriptions   No medications on file   I personally performed the services described in this documentation, which was scribed in my presence. The recorded information has been reviewed and is accurate.       Everlene FarrierWilliam Eidan Muellner, PA-C 08/07/16 0110    Loren Raceravid Yelverton, MD 08/08/16 [id number]

## 2016-08-06 NOTE — ED Notes (Signed)
Patient transported to CT 

## 2016-08-06 NOTE — ED Triage Notes (Signed)
Pt with abd pain, bloating, decreased UO x today-n/v x 2 weeks-NAD-presents to triage in w/c

## 2016-08-07 ENCOUNTER — Encounter (HOSPITAL_COMMUNITY): Payer: Self-pay | Admitting: Internal Medicine

## 2016-08-07 ENCOUNTER — Inpatient Hospital Stay (HOSPITAL_COMMUNITY): Payer: Medicare Other

## 2016-08-07 DIAGNOSIS — I10 Essential (primary) hypertension: Secondary | ICD-10-CM

## 2016-08-07 DIAGNOSIS — R112 Nausea with vomiting, unspecified: Secondary | ICD-10-CM

## 2016-08-07 DIAGNOSIS — I482 Chronic atrial fibrillation: Secondary | ICD-10-CM

## 2016-08-07 DIAGNOSIS — K810 Acute cholecystitis: Secondary | ICD-10-CM

## 2016-08-07 DIAGNOSIS — N179 Acute kidney failure, unspecified: Secondary | ICD-10-CM

## 2016-08-07 DIAGNOSIS — N400 Enlarged prostate without lower urinary tract symptoms: Secondary | ICD-10-CM

## 2016-08-07 DIAGNOSIS — R1030 Lower abdominal pain, unspecified: Secondary | ICD-10-CM

## 2016-08-07 DIAGNOSIS — R339 Retention of urine, unspecified: Secondary | ICD-10-CM

## 2016-08-07 DIAGNOSIS — R791 Abnormal coagulation profile: Secondary | ICD-10-CM

## 2016-08-07 LAB — CBC WITH DIFFERENTIAL/PLATELET
BASOS ABS: 0 10*3/uL (ref 0.0–0.1)
BASOS PCT: 0 %
Eosinophils Absolute: 0 10*3/uL (ref 0.0–0.7)
Eosinophils Relative: 0 %
HEMATOCRIT: 28.9 % — AB (ref 39.0–52.0)
Hemoglobin: 9.3 g/dL — ABNORMAL LOW (ref 13.0–17.0)
LYMPHS PCT: 9 %
Lymphs Abs: 0.6 10*3/uL — ABNORMAL LOW (ref 0.7–4.0)
MCH: 27 pg (ref 26.0–34.0)
MCHC: 32.2 g/dL (ref 30.0–36.0)
MCV: 83.8 fL (ref 78.0–100.0)
MONOS PCT: 8 %
Monocytes Absolute: 0.6 10*3/uL (ref 0.1–1.0)
NEUTROS ABS: 5.8 10*3/uL (ref 1.7–7.7)
NEUTROS PCT: 83 %
Platelets: 143 10*3/uL — ABNORMAL LOW (ref 150–400)
RBC: 3.45 MIL/uL — ABNORMAL LOW (ref 4.22–5.81)
RDW: 14.9 % (ref 11.5–15.5)
WBC: 7.1 10*3/uL (ref 4.0–10.5)

## 2016-08-07 LAB — TYPE AND SCREEN
ABO/RH(D): A POS
Antibody Screen: NEGATIVE

## 2016-08-07 LAB — COMPREHENSIVE METABOLIC PANEL
ALBUMIN: 2.9 g/dL — AB (ref 3.5–5.0)
ALK PHOS: 104 U/L (ref 38–126)
ALT: 41 U/L (ref 17–63)
AST: 42 U/L — AB (ref 15–41)
Anion gap: 9 (ref 5–15)
BUN: 55 mg/dL — AB (ref 6–20)
CALCIUM: 8.6 mg/dL — AB (ref 8.9–10.3)
CO2: 26 mmol/L (ref 22–32)
CREATININE: 2.69 mg/dL — AB (ref 0.61–1.24)
Chloride: 106 mmol/L (ref 101–111)
GFR calc Af Amer: 22 mL/min — ABNORMAL LOW (ref >60)
GFR calc non Af Amer: 19 mL/min — ABNORMAL LOW (ref >60)
GLUCOSE: 88 mg/dL (ref 65–99)
Potassium: 3.5 mmol/L (ref 3.5–5.1)
Sodium: 141 mmol/L (ref 135–145)
TOTAL PROTEIN: 6.1 g/dL — AB (ref 6.5–8.1)
Total Bilirubin: 2.9 mg/dL — ABNORMAL HIGH (ref 0.3–1.2)

## 2016-08-07 LAB — PROTIME-INR
INR: 4.13
INR: 4.01 — AB
Prothrombin Time: 41.1 seconds — ABNORMAL HIGH (ref 11.4–15.2)
Prothrombin Time: 40.1 seconds — ABNORMAL HIGH (ref 11.4–15.2)

## 2016-08-07 LAB — GLUCOSE, CAPILLARY
GLUCOSE-CAPILLARY: 63 mg/dL — AB (ref 65–99)
GLUCOSE-CAPILLARY: 66 mg/dL (ref 65–99)
GLUCOSE-CAPILLARY: 98 mg/dL (ref 65–99)
Glucose-Capillary: 79 mg/dL (ref 65–99)

## 2016-08-07 LAB — ABO/RH: ABO/RH(D): A POS

## 2016-08-07 MED ORDER — LATANOPROST 0.005 % OP SOLN
1.0000 [drp] | Freq: Every day | OPHTHALMIC | Status: DC
  Administered 2016-08-07 – 2016-08-13 (×7): 1 [drp] via OPHTHALMIC
  Filled 2016-08-07: qty 2.5

## 2016-08-07 MED ORDER — PIPERACILLIN-TAZOBACTAM IN DEX 2-0.25 GM/50ML IV SOLN
2.2500 g | Freq: Four times a day (QID) | INTRAVENOUS | Status: DC
  Administered 2016-08-07 – 2016-08-10 (×13): 2.25 g via INTRAVENOUS
  Filled 2016-08-07 (×13): qty 50

## 2016-08-07 MED ORDER — NITROGLYCERIN 0.4 MG SL SUBL: 0.4000 mg | SUBLINGUAL_TABLET | SUBLINGUAL | Status: DC | PRN

## 2016-08-07 MED ORDER — MUSCLE RUB 10-15 % EX CREA
1.0000 "application " | TOPICAL_CREAM | CUTANEOUS | Status: DC | PRN
  Administered 2016-08-08: 1 via TOPICAL
  Filled 2016-08-07: qty 85

## 2016-08-07 MED ORDER — FEBUXOSTAT 40 MG PO TABS
40.0000 mg | ORAL_TABLET | Freq: Every day | ORAL | Status: DC
  Administered 2016-08-08 – 2016-08-14 (×7): 40 mg via ORAL
  Filled 2016-08-07 (×8): qty 1

## 2016-08-07 MED ORDER — TECHNETIUM TC 99M MEBROFENIN IV KIT
7.8000 | PACK | Freq: Once | INTRAVENOUS | Status: AC | PRN
  Administered 2016-08-07: 7.8 via INTRAVENOUS

## 2016-08-07 MED ORDER — TAMSULOSIN HCL 0.4 MG PO CAPS
0.4000 mg | ORAL_CAPSULE | Freq: Every day | ORAL | Status: DC
  Administered 2016-08-07: 0.4 mg via ORAL
  Filled 2016-08-07: qty 1

## 2016-08-07 MED ORDER — SODIUM CHLORIDE 0.9 % IV SOLN
INTRAVENOUS | Status: AC
  Administered 2016-08-07 (×2): via INTRAVENOUS

## 2016-08-07 MED ORDER — PRAVASTATIN SODIUM 40 MG PO TABS
40.0000 mg | ORAL_TABLET | Freq: Every day | ORAL | Status: DC
  Administered 2016-08-08 – 2016-08-14 (×7): 40 mg via ORAL
  Filled 2016-08-07 (×7): qty 1

## 2016-08-07 MED ORDER — PREDNISONE 5 MG PO TABS
5.0000 mg | ORAL_TABLET | Freq: Every day | ORAL | Status: DC
  Administered 2016-08-08 – 2016-08-11 (×4): 5 mg via ORAL
  Filled 2016-08-07 (×4): qty 1

## 2016-08-07 MED ORDER — FINASTERIDE 5 MG PO TABS
5.0000 mg | ORAL_TABLET | Freq: Every day | ORAL | Status: DC
Start: 2016-08-07 — End: 2016-08-14
  Administered 2016-08-08 – 2016-08-14 (×7): 5 mg via ORAL
  Filled 2016-08-07 (×7): qty 1

## 2016-08-07 MED ORDER — WARFARIN - PHARMACIST DOSING INPATIENT: Freq: Every day | Status: DC

## 2016-08-07 MED ORDER — ACETAMINOPHEN 325 MG PO TABS
650.0000 mg | ORAL_TABLET | Freq: Four times a day (QID) | ORAL | Status: DC | PRN
  Administered 2016-08-08 – 2016-08-14 (×7): 650 mg via ORAL
  Filled 2016-08-07 (×7): qty 2

## 2016-08-07 MED ORDER — PANTOPRAZOLE SODIUM 40 MG PO TBEC
40.0000 mg | DELAYED_RELEASE_TABLET | Freq: Every day | ORAL | Status: DC
  Administered 2016-08-08 – 2016-08-14 (×7): 40 mg via ORAL
  Filled 2016-08-07 (×7): qty 1

## 2016-08-07 MED ORDER — METOPROLOL TARTRATE 50 MG PO TABS
50.0000 mg | ORAL_TABLET | Freq: Two times a day (BID) | ORAL | Status: DC
  Administered 2016-08-07 – 2016-08-14 (×14): 50 mg via ORAL
  Filled 2016-08-07 (×14): qty 1

## 2016-08-07 MED ORDER — ACETAMINOPHEN 650 MG RE SUPP
650.0000 mg | Freq: Four times a day (QID) | RECTAL | Status: DC | PRN
  Filled 2016-08-07: qty 1

## 2016-08-07 MED ORDER — LEVOTHYROXINE SODIUM 50 MCG PO TABS
50.0000 ug | ORAL_TABLET | Freq: Every day | ORAL | Status: DC
Start: 2016-08-07 — End: 2016-08-14
  Administered 2016-08-07 – 2016-08-14 (×8): 50 ug via ORAL
  Filled 2016-08-07 (×8): qty 1

## 2016-08-07 MED ORDER — DILTIAZEM HCL ER COATED BEADS 120 MG PO CP24
120.0000 mg | ORAL_CAPSULE | Freq: Every day | ORAL | Status: DC
  Administered 2016-08-08 – 2016-08-13 (×6): 120 mg via ORAL
  Filled 2016-08-07 (×7): qty 1

## 2016-08-07 NOTE — Progress Notes (Signed)
Pharmacy Antibiotic Note  Troy [name] is a 83 y.o.Troy [name] male admitted on 08/06/2016 with Intra-abdominal infection.  Pharmacy has been consulted for zosyn dosing.  Plan: Zosyn 2.25gm iv q6hr  Height: 5\' 8"  (172.7 cm) Weight: 150 lb 1.6 oz (68.1 kg) IBW/kg (Calculated) : 68.4  Temp (24hrs), Avg:97.9 F (36.6 C), Min:97.9 F (36.6 C), Max:97.9 F (36.6 C)   Recent Labs Lab 08/06/16 1824  WBC 8.8  CREATININE 3.13*    Estimated Creatinine Clearance: 14.2 mL/min (A) (by C-G formula based on SCr of 3.13 mg/dL (H)).    No Known Allergies  Antimicrobials this admission: Zosyn 08/07/2016 >>   Dose adjustments this admission: -  Microbiology results: pending  Thank you for allowing pharmacy to be a part of this patient's care.  Troy [name], Troy [name] 08/07/2016 3:37 AM

## 2016-08-07 NOTE — Consult Note (Signed)
Reason for Consult:abdominal pain Referring Physician: Rise Patience, MD     Troy [name] is an 83 y.o. male.  HPI: She presents to the ED, yesterday with abdominal pain, bloating decreased urine output, nausea and vomiting 2 weeks. His main issues been trouble affording. He is unable to void for the last 2 days prior to admission. He lives alone and his daughter was unaware of this problem. Has multiple medical issues as listed below and is on Coumadin. Most likely for his chronic atrial fibrillation. Workup movements in [facility] shows he is afebrile, vital signs are stable. Sodium is 134, glucose 181, creatinine 3.13 total bilirubin 3.9. AST 66, ALT 52. WBC is 8.8 hemoglobin 10.3 hematocrit 32.3. Platelets are 145,000. CT scan obtained bilateral renal cystic disease with no stones or masses. Foley catheter is in the bladder. Stomach is normal. Duodenum is normal small bowel is within normal limits inflammatory changes present around the ascending colon. The appendix was normal. Transverse colon was unremarkable.; Showed diverticuli without inflammation. Somewhat irregular suggesting cirrhosis. No lesions are present. Common bile duct is within normal limits there is thickening and inflammation of the gallbladder suggesting acute cholecystitis. No free air or fluid. Labs this a.m. shows creatinine is down to 2.69 bilirubin 2.9 ALT 41, AST 42. WBC stable at 7.1. A HIDA scan is been seen in consultation were asked to see for possible cholecystitis.  Past Medical History:  Diagnosis Date  . Chronic anticoagulation 2011   on coumadin  . Coronary artery disease 2012   PCI-LAD & LCX, Promus Des  . Echocardiogram abnormal 11/07/2010   EF 50-55%, LA mild to mod dilated, mod MR, RV SYSTOLIC PRESSure 95-62 mild-mod pul. HTN   . H/O cardiovascular stress test 11/07/2010   new scar in LCX distribution  . Hyperlipidemia LDL goal < 70   . Hypertension   . Myocardial infarction 05/23/2010   non-ST  elevation MI  . Permanent atrial fibrillation ([place])   . Pulmonary embolus ([place]) 05/23/2010  . Pulmonary hypertension    mild to moderate    Past Surgical History:  Procedure Laterality Date  . CARDIAC CATHETERIZATION  05/23/10   high grade LAD and circumflex disease, EF 50% stented circumflex w/ Promus drug-eluding stent  . CARDIAC CATHETERIZATION  05/28/2010   LAD sten w/ Promus drug eluding stent  . CORONARY ANGIOPLASTY WITH STENT PLACEMENT      Family History  Problem Relation Age of Onset  . Family history unknown: Yes    Social History:  reports that he quit smoking about 42 years ago. He has never used smokeless tobacco. He reports that he does not drink alcohol or use drugs. Tobacco: 30 years less than 1PPD ETOH: When young some, not since he had children Drugs:  none  Allergies: No Known Allergies  Medications:  Prior to Admission:  Prescriptions Prior to Admission  Medication Sig Dispense Refill Last Dose  . aspirin 81 MG EC tablet Take 81 mg by mouth daily.     08/06/2016 at Unknown time  . diltiazem (CARDIZEM CD) 120 MG 24 hr capsule Take 1 capsule (120 mg total) by mouth daily. 30 capsule 0 08/06/2016 at Unknown time  . finasteride (PROSCAR) 5 MG tablet Take 5 mg by mouth daily.    08/05/2016 at Unknown time  . furosemide (LASIX) 40 MG tablet Take 1 tablet (40 mg total) by mouth daily. PATIENT NEEDS TO CONTACT OFFICE FOR ADDITIONAL REFILLS (Patient taking differently: Take 40 mg by mouth daily.  TAKE EVERY OTHER DAY.) 90 tablet 0 08/05/2016  . levothyroxine (SYNTHROID, LEVOTHROID) 50 MCG tablet Take 50 mcg by mouth daily.  4 08/06/2016 at Unknown time  . lisinopril (PRINIVIL,ZESTRIL) 20 MG tablet Take 20 mg by mouth daily.    08/06/2016 at Unknown time  . metoprolol (LOPRESSOR) 50 MG tablet Take 1 tablet (50 mg total) by mouth 2 (two) times daily. 60 tablet 0 08/06/2016 at 8AM  . pantoprazole (PROTONIX) 40 MG tablet Take 1 tablet (40 mg total) by mouth daily. 30 tablet 0  08/06/2016 at Unknown time  . pravastatin (PRAVACHOL) 40 MG tablet Take 40 mg by mouth daily.   08/07/2016 at Unknown time  . predniSONE (DELTASONE) 5 MG tablet Take 5 mg by mouth daily.     08/06/2016 at Unknown time  . Tamsulosin HCl (FLOMAX) 0.4 MG CAPS Take 0.4 mg by mouth at bedtime.     08/05/2016  . ULORIC 40 MG tablet TAKE 1 TABLET BY MOUTH EVERY DAY 30 tablet 11 07/31/2016  . warfarin (COUMADIN) 2.5 MG tablet Take 1.25-2.5 mg by mouth daily. TAKES 1.25 Sunday Tuesday and Friday night,and 2.5 mg the rest of the week.   08/05/2016 at 7pm  . nitroGLYCERIN (NITROSTAT) 0.4 MG SL tablet Place 0.4 mg under the tongue every 5 (five) minutes as needed.   never   Scheduled: . diltiazem  120 mg Oral Daily  . febuxostat  40 mg Oral Daily  . finasteride  5 mg Oral Daily  . latanoprost  1 drop Both Eyes QHS  . levothyroxine  50 mcg Oral QAC breakfast  . metoprolol  50 mg Oral BID  . pantoprazole  40 mg Oral Daily  . piperacillin-tazobactam (ZOSYN)  IV  2.25 g Intravenous Q6H  . pravastatin  40 mg Oral Daily  . predniSONE  5 mg Oral Daily  . tamsulosin  0.4 mg Oral QHS   Continuous: . sodium chloride 75 mL/hr at 08/07/16 0315   PRN:acetaminophen **OR** acetaminophen, nitroGLYCERIN Anti-infectives    Start     Dose/Rate Route Frequency Ordered Stop   08/07/16 0600  piperacillin-tazobactam (ZOSYN) IVPB 2.25 g     2.25 g 100 mL/hr over 30 Minutes Intravenous Every 6 hours 08/07/16 0332     03 /14/18 2245  piperacillin-tazobactam (ZOSYN) IVPB 3.375 g     3.375 g 100 mL/hr over 30 Minutes Intravenous  Once 08/06/16 2234 08/07/16 0021      Results for orders placed or performed during the hospital encounter of 08/06/16 (from the past 48 hour(s))  Comprehensive metabolic panel     Status: Abnormal   Collection Time: 08/06/16  6:24 PM  Result Value Ref Range   Sodium 134 (L) 135 - 145 mmol/L   Potassium 3.6 3.5 - 5.1 mmol/L   Chloride 98 (L) 101 - 111 mmol/L   CO2 26 22 - 32 mmol/L   Glucose,  Bld 181 (H) 65 - 99 mg/dL   BUN 57 (H) 6 - 20 mg/dL   Creatinine, Ser 3.13 (H) 0.61 - 1.24 mg/dL   Calcium 8.6 (L) 8.9 - 10.3 mg/dL   Total Protein 7.2 6.5 - 8.1 g/dL   Albumin 3.4 (L) 3.5 - 5.0 g/dL   AST 66 (H) 15 - 41 U/L   ALT 52 17 - 63 U/L   Alkaline Phosphatase 123 38 - 126 U/L   Total Bilirubin 3.9 (H) 0.3 - 1.2 mg/dL   GFR calc non Af Amer 16 (L) >60 mL/min   GFR calc Af  Amer 18 (L) >60 mL/min    Comment: (NOTE) The eGFR has been calculated using the CKD EPI equation. This calculation has not been validated in all clinical situations. eGFR's persistently <60 mL/min signify possible Chronic Kidney Disease.    Anion gap 10 5 - 15  CBC with Differential     Status: Abnormal   Collection Time: 08/06/16  6:24 PM  Result Value Ref Range   WBC 8.8 4.0 - 10.5 K/uL   RBC 3.69 (L) 4.22 - 5.81 MIL/uL   Hemoglobin 10.3 (L) 13.0 - 17.0 g/dL   HCT 32.3 (L) 39.0 - 52.0 %   MCV 87.5 78.0 - 100.0 fL   MCH 27.9 26.0 - 34.0 pg   MCHC 31.9 30.0 - 36.0 g/dL   RDW 14.9 11.5 - 15.5 %   Platelets 145 (L) 150 - 400 K/uL   Neutrophils Relative % 83 %   Lymphocytes Relative 7 %   Monocytes Relative 10 %   Eosinophils Relative 0 %   Basophils Relative 0 %   Neutro Abs 7.3 1.7 - 7.7 K/uL   Lymphs Abs 0.6 (L) 0.7 - 4.0 K/uL   Monocytes Absolute 0.9 0.1 - 1.0 K/uL   Eosinophils Absolute 0.0 0.0 - 0.7 K/uL   Basophils Absolute 0.0 0.0 - 0.1 K/uL   RBC Morphology ELLIPTOCYTES    Smear Review LARGE PLATELETS PRESENT   Protime-INR     Status: Abnormal   Collection Time: 08/06/16  6:24 PM  Result Value Ref Range   Prothrombin Time 42.7 (H) 11.4 - 15.2 seconds   INR 4.34 (HH)     Comment: CRITICAL RESULT CALLED TO, READ BACK BY AND VERIFIED WITH: SAM COBLE RN AT 1932 ON 08/06/16 BY I.SUGUT REPEATED TO VERIFY   Urinalysis, Routine w reflex microscopic     Status: Abnormal   Collection Time: 08/06/16  7:30 PM  Result Value Ref Range   Color, Urine AMBER (A) YELLOW    Comment: BIOCHEMICALS  MAY BE AFFECTED BY COLOR   APPearance CLOUDY (A) CLEAR   Specific Gravity, Urine 1.012 1.005 - 1.030   pH 5.5 5.0 - 8.0   Glucose, UA NEGATIVE NEGATIVE mg/dL   Hgb urine dipstick SMALL (A) NEGATIVE   Bilirubin Urine SMALL (A) NEGATIVE   Ketones, ur NEGATIVE NEGATIVE mg/dL   Protein, ur 30 (A) NEGATIVE mg/dL   Nitrite NEGATIVE NEGATIVE   Leukocytes, UA NEGATIVE NEGATIVE  Urinalysis, Microscopic (reflex)     Status: Abnormal   Collection Time: 08/06/16  7:30 PM  Result Value Ref Range   RBC / HPF 0-5 0 - 5 RBC/hpf   WBC, UA NONE SEEN 0 - 5 WBC/hpf   Bacteria, UA RARE (A) NONE SEEN   Squamous Epithelial / LPF 0-5 (A) NONE SEEN  Type and screen [place]     Status: None   Collection Time: 08/07/16  5:33 AM  Result Value Ref Range   ABO/RH(D) A POS    Antibody Screen NEG    Sample Expiration 08/10/2016   CBC with Differential/Platelet     Status: Abnormal   Collection Time: 08/07/16  5:36 AM  Result Value Ref Range   WBC 7.1 4.0 - 10.5 K/uL   RBC 3.45 (L) 4.22 - 5.81 MIL/uL   Hemoglobin 9.3 (L) 13.0 - 17.0 g/dL   HCT 28.9 (L) 39.0 - 52.0 %   MCV 83.8 78.0 - 100.0 fL   MCH 27.0 26.0 - 34.0 pg   MCHC 32.2 30.0 -  36.0 g/dL   RDW 14.9 11.5 - 15.5 %   Platelets 143 (L) 150 - 400 K/uL   Neutrophils Relative % 83 %   Neutro Abs 5.8 1.7 - 7.7 K/uL   Lymphocytes Relative 9 %   Lymphs Abs 0.6 (L) 0.7 - 4.0 K/uL   Monocytes Relative 8 %   Monocytes Absolute 0.6 0.1 - 1.0 K/uL   Eosinophils Relative 0 %   Eosinophils Absolute 0.0 0.0 - 0.7 K/uL   Basophils Relative 0 %   Basophils Absolute 0.0 0.0 - 0.1 K/uL  Comprehensive metabolic panel     Status: Abnormal   Collection Time: 08/07/16  5:36 AM  Result Value Ref Range   Sodium 141 135 - 145 mmol/L   Potassium 3.5 3.5 - 5.1 mmol/L   Chloride 106 101 - 111 mmol/L   CO2 26 22 - 32 mmol/L   Glucose, Bld 88 65 - 99 mg/dL   BUN 55 (H) 6 - 20 mg/dL   Creatinine, Ser 2.69 (H) 0.61 - 1.24 mg/dL   Calcium 8.6 (L)  8.9 - 10.3 mg/dL   Total Protein 6.1 (L) 6.5 - 8.1 g/dL   Albumin 2.9 (L) 3.5 - 5.0 g/dL   AST 42 (H) 15 - 41 U/L   ALT 41 17 - 63 U/L   Alkaline Phosphatase 104 38 - 126 U/L   Total Bilirubin 2.9 (H) 0.3 - 1.2 mg/dL   GFR calc non Af Amer 19 (L) >60 mL/min   GFR calc Af Amer 22 (L) >60 mL/min    Comment: (NOTE) The eGFR has been calculated using the CKD EPI equation. This calculation has not been validated in all clinical situations. eGFR's persistently <60 mL/min signify possible Chronic Kidney Disease.    Anion gap 9 5 - 15  Protime-INR     Status: Abnormal   Collection Time: 08/07/16  5:36 AM  Result Value Ref Range   Prothrombin Time 41.1 (H) 11.4 - 15.2 seconds   INR 4.13 (HH)     Comment: CRITICAL RESULT CALLED TO, READ BACK BY AND VERIFIED WITH: DEUTSCH,J. RN @0722  ON 3.15.18 BY NMCCOY   ABO/Rh     Status: None   Collection Time: 08/07/16  5:36 AM  Result Value Ref Range   ABO/RH(D) A POS   Protime-INR     Status: Abnormal   Collection Time: 08/07/16  7:04 AM  Result Value Ref Range   Prothrombin Time 40.1 (H) 11.4 - 15.2 seconds   INR 4.01 (HH)     Comment: CRITICAL RESULT CALLED TO, READ BACK BY AND VERIFIED WITH: DARK,R. RN @0858  ON 3.15.18 BY NMCCOY   Glucose, capillary     Status: None   Collection Time: 08/07/16  7:56 AM  Result Value Ref Range   Glucose-Capillary 79 65 - 99 mg/dL    Ct Abdomen Pelvis Wo Contrast  Result Date: 08/06/2016 CLINICAL DATA:  Abdominal pain and bloating. Decreased urine output back. EXAM: CT ABDOMEN AND PELVIS WITHOUT CONTRAST TECHNIQUE: Multidetector CT imaging of the abdomen and pelvis was performed following the standard protocol without IV contrast. COMPARISON:  Renal ultrasound 10/18/2014. FINDINGS: Lower chest: A small right pleural effusion is present. Right greater than left lower lobe airspace disease is present. The heart is enlarged. Coronary artery calcifications are present. No significant pericardial effusion is  present. Hepatobiliary: The liver is somewhat irregular suggesting cirrhosis. No discrete lesions are present. The common bile duct is within normal limits. There is wall thickening and inflammatory change  about the gallbladder suggesting acute cholecystitis. There is noted free air or fluid. Pancreas: Within normal limits. Spleen: Normal in size without focal abnormality. Adrenals/Urinary Tract: The adrenal glands are normal bilaterally. Bilateral renal cystic disease is again noted. There is no stone or mass lesion. The central sinus cyst is present on the right. The ureters are within normal limits bilaterally. A Foley catheter is present within the urinary bladder. Stomach/Bowel: The stomach is within normal limits. The duodenum is unremarkable. Small bowel is within normal limits. Inflammatory changes are present about the ascending colon the appendix is visualized and normal. The transverse colon is unremarkable. The descending colon demonstrates diverticula without focal inflammation to suggest diverticulitis. Additional diverticular present throughout the sigmoid colon without focal inflammation. Vascular/Lymphatic: Atherosclerotic calcifications are present in the aorta and branch vessels without aneurysm. No significant adenopathy is present. Reproductive: Calcifications are present within the prostate gland. The prostate gland is markedly enlarged at 7.1 cm in transverse diameter. Other: No free air free fluid is present. Musculoskeletal: Multilevel facet degenerative changes are present. Slight degenerate anterolisthesis is present at L4-5. Leftward curvature of the lumbar spine is centered at L1. There is rightward curvature at L5. Pelvis is intact. The hips are located. IMPRESSION: 1. Wall thickening and diffuse inflammatory changes about the gallbladder suggesting acute cholecystitis. 2. Inflammatory changes about the ascending colon likely represent secondary reactive change without focal colitis. 3.  Sigmoid diverticulosis without diverticulitis. 4. Extensive atherosclerotic change without aneurysm. 5. Marked dilation of the prostate gland. 6. Cardiomegaly. 7. Small right pleural effusion. 8. Mild irregularity of the liver suggesting cirrhosis. 9. Bilateral renal cystic disease. Electronically Signed   By: San Morelle M.D.   On: 08/06/2016 20:25    Review of Systems  HENT: Negative.   Eyes: Negative.   Respiratory: Positive for shortness of breath (DOE).   Cardiovascular: Negative.   Gastrointestinal: Positive for heartburn, nausea and vomiting. Negative for abdominal pain, blood in stool, constipation, diarrhea and melena.  Genitourinary:       Unable to void, nothing for the last 2 days.  Foley inserted and now bleeding from the penis around the foley.    Musculoskeletal: Negative.        Legs give out walking short distance  Skin: Negative.   Neurological: Positive for weakness.  Endo/Heme/Allergies: Negative for environmental allergies and polydipsia. Bruises/bleeds easily.  Psychiatric/Behavioral: Negative.    Blood pressure 139/74, pulse 81, temperature 97.8 F (36.6 C), temperature source Oral, resp. rate 18, height 5' 8"  (1.727 m), weight 68.9 kg (151 lb 14.4 oz), SpO2 97 %. Physical Exam  Constitutional: He is oriented to person, place, and time. He appears well-developed.  Elderly white male in no acute distress. A Foley catheter in place with blood on the catheter and on the bed sheet from bleeding around the penis.  HENT:  Head: Normocephalic and atraumatic.  Mouth/Throat: No oropharyngeal exudate.  Eyes: Right eye exhibits no discharge. Left eye exhibits no discharge. No scleral icterus.  Neck: Neck supple. No JVD present. No tracheal deviation present. No thyromegaly present.  Cardiovascular: Normal rate, regular rhythm, normal heart sounds and intact distal pulses.   Respiratory: Effort normal and breath sounds normal. No respiratory distress. He has no  wheezes. He has no rales. He exhibits no tenderness.  GI: Soft. Bowel sounds are normal. He exhibits no distension and no mass. There is no tenderness. There is no rebound and no guarding.  Genitourinary: Rectal exam shows guaiac negative stool.  Genitourinary Comments:  Foley in place with bleeding the meatus.  Musculoskeletal: He exhibits no edema or tenderness.  Lymphadenopathy:    He has no cervical adenopathy.  Neurological: He is alert and oriented to person, place, and time. No cranial nerve deficit.  Skin: Skin is warm and dry. No rash noted. No erythema. No pallor.  Psychiatric: He has a normal mood and affect. His behavior is normal. Judgment and thought content normal.    Assessment/Plan: Urinary retention. Hypercoagulable on Coumadin - IBR 4.01 Possible cholecystitis/ascending colon inflammation CAD with DES stents 2012 History of atrial fibrillation on Coumadin History of pulmonary embolus Hypertension Hypothyroid   Plan: He was admitted by medicine a Foley was placed, urinary retention is resolved. He scheduled for a HIDA scan later today for further evaluation of his possible cholecystitis. He is on Zosyn which will cover this and his possible ascending colon inflammatory changes. We will follow with you.  If he needs surgery he will have to have coagulation issues resolved before this can be done.    Korver Graybeal 08/07/2016, 9:12 AM

## 2016-08-07 NOTE — H&P (Signed)
History and Physical    Troy PiaRalph Z Golden WUJ:[id number] DOB: 06/19/1920 DOA: 08/06/2016  PCP: Pearson GrippeJames Kim, MD  Patient coming from: Home.  Chief Complaint: Abdominal pain.  HPI: Troy [name] is a 83 y.o. male with history of CAD status post stenting, atrial fibrillation, history of PE, history of hypertension hyperlipidemia and chronic steroids presents to the ER because of increasing lower abdominal pain with difficulty urinating. Denies fever chills diarrhea nausea vomiting.   ED Course: In the ER CT of the abdomen and pelvis done without contrast shows features concerning for acute cholecystitis. Patient also had urinary retention for which patient had Foley catheter Placed. Creatinine has increased from baseline probably from obstruction. On-call general surgeon Dr. Janee Mornhompson was consulted by ER physician. They will be seeing patient in consult. Patient is also on Coumadin for A. fib and INR is around 4.  Review of Systems: As per HPI, rest all negative.   Past Medical History:  Diagnosis Date  . Chronic anticoagulation 2011   on coumadin  . Coronary artery disease 2012   PCI-LAD & LCX, Promus Des  . Echocardiogram abnormal 11/07/2010   EF 50-55%, LA mild to mod dilated, mod MR, RV SYSTOLIC PRESSure 40-50 mild-mod pul. HTN   . H/O cardiovascular stress test 11/07/2010   new scar in LCX distribution  . Hyperlipidemia LDL goal < 70   . Hypertension   . Myocardial infarction 05/23/2010   non-ST elevation MI  . Permanent atrial fibrillation (HCC)   . Pulmonary embolus (HCC) 05/23/2010  . Pulmonary hypertension    mild to moderate    Past Surgical History:  Procedure Laterality Date  . CARDIAC CATHETERIZATION  05/23/10   high grade LAD and circumflex disease, EF 50% stented circumflex w/ Promus drug-eluding stent  . CARDIAC CATHETERIZATION  05/28/2010   LAD sten w/ Promus drug eluding stent  . CORONARY ANGIOPLASTY WITH STENT PLACEMENT       reports that he quit smoking about  42 years ago. He has never used smokeless tobacco. He reports that he does not drink alcohol or use drugs.  No Known Allergies  Family History  Problem Relation Age of Onset  . Family history unknown: Yes    Prior to Admission medications   Medication Sig Start Date End Date Taking? Authorizing Provider  aspirin 81 MG EC tablet Take 81 mg by mouth daily.      Historical Provider, MD  diltiazem (CARDIZEM CD) 120 MG 24 hr capsule Take 1 capsule (120 mg total) by mouth daily. 07/29/16   Runell GessJonathan J Berry, MD  finasteride (PROSCAR) 5 MG tablet Take 5 mg by mouth daily.  08/05/13   Historical Provider, MD  furosemide (LASIX) 40 MG tablet Take 1 tablet (40 mg total) by mouth daily. PATIENT NEEDS TO CONTACT OFFICE FOR ADDITIONAL REFILLS Patient taking differently: Take 40 mg by mouth daily. TAKE EVERY OTHER DAY. 12/04/14   Runell GessJonathan J Berry, MD  latanoprost (XALATAN) 0.005 % ophthalmic solution Place 1 drop into both eyes at bedtime.    Historical Provider, MD  levothyroxine (SYNTHROID, LEVOTHROID) 50 MCG tablet Take 50 mcg by mouth daily. 02/16/15   Historical Provider, MD  lisinopril (PRINIVIL,ZESTRIL) 20 MG tablet Take 10 mg by mouth daily.     Historical Provider, MD  metoprolol (LOPRESSOR) 50 MG tablet Take 1 tablet (50 mg total) by mouth 2 (two) times daily. 07/18/16   Runell GessJonathan J Berry, MD  nitroGLYCERIN (NITROSTAT) 0.4 MG SL tablet Place 0.4 mg under the  tongue every 5 (five) minutes as needed.    Historical Provider, MD  pantoprazole (PROTONIX) 40 MG tablet Take 1 tablet (40 mg total) by mouth daily. 07/29/16   Runell Gess, MD  pravastatin (PRAVACHOL) 40 MG tablet Take 40 mg by mouth daily. 02/07/13   Historical Provider, MD  predniSONE (DELTASONE) 5 MG tablet Take 5 mg by mouth daily.      Historical Provider, MD  Tamsulosin HCl (FLOMAX) 0.4 MG CAPS Take 0.4 mg by mouth at bedtime.      Historical Provider, MD  ULORIC 40 MG tablet TAKE 1 TABLET BY MOUTH EVERY DAY 08/04/16   Runell Gess, MD    warfarin (COUMADIN) 5 MG tablet Take 1 tablet by mouth daily 12/10/12   Rosalee Kaufman, RPH-CPP    Physical Exam: Vitals:   08/06/16 2300 08/06/16 2330 08/07/16 0000 08/07/16 0126  BP: 134/69 136/89 127/74 (!) 141/78  Pulse: 90 95 107 93  Resp: 19 18 21 20   Temp:    97.9 F (36.6 C)  TempSrc:    Oral  SpO2: 91% 95% 93% 97%  Weight:    68.1 kg (150 lb 1.6 oz)  Height:    5\' 8"  (1.727 m)      Constitutional: Moderately built and nourished. Vitals:   08/06/16 2300 08/06/16 2330 08/07/16 0000 08/07/16 0126  BP: 134/69 136/89 127/74 (!) 141/78  Pulse: 90 95 107 93  Resp: 19 18 21 20   Temp:    97.9 F (36.6 C)  TempSrc:    Oral  SpO2: 91% 95% 93% 97%  Weight:    68.1 kg (150 lb 1.6 oz)  Height:    5\' 8"  (1.727 m)   Eyes: Anicteric no pallor. ENMT: No discharge from the ears eyes nose and mouth. Neck: No mass felt. No neck rigidity. No JVD appreciated. Respiratory: No rhonchi or crepitations. Cardiovascular: S1-S2 heard. Abdomen: Soft nontender bowel sounds present. Musculoskeletal: No edema. No joint effusion. Skin: No rash. Skin appears warm. Neurologic: Alert awake oriented to time place and person. Moves all extremities. Psychiatric: Appears normal. Normal affect.   Labs on Admission: I have personally reviewed following labs and imaging studies  CBC:  Recent Labs Lab 08/06/16 1824  WBC 8.8  NEUTROABS 7.3  HGB 10.3*  HCT 32.3*  MCV 87.5  PLT 145*   Basic Metabolic Panel:  Recent Labs Lab 08/06/16 1824  NA 134*  K 3.6  CL 98*  CO2 26  GLUCOSE 181*  BUN 57*  CREATININE 3.13*  CALCIUM 8.6*   GFR: Estimated Creatinine Clearance: 14.2 mL/min (A) (by C-G formula based on SCr of 3.13 mg/dL (H)). Liver Function Tests:  Recent Labs Lab 08/06/16 1824  AST 66*  ALT 52  ALKPHOS 123  BILITOT 3.9*  PROT 7.2  ALBUMIN 3.4*   No results for input(s): LIPASE, AMYLASE in the last 168 hours. No results for input(s): AMMONIA in the last 168  hours. Coagulation Profile:  Recent Labs Lab 08/06/16 1824  INR 4.34*   Cardiac Enzymes: No results for input(s): CKTOTAL, CKMB, CKMBINDEX, TROPONINI in the last 168 hours. BNP (last 3 results) No results for input(s): PROBNP in the last 8760 hours. HbA1C: No results for input(s): HGBA1C in the last 72 hours. CBG: No results for input(s): GLUCAP in the last 168 hours. Lipid Profile: No results for input(s): CHOL, HDL, LDLCALC, TRIG, CHOLHDL, LDLDIRECT in the last 72 hours. Thyroid Function Tests: No results for input(s): TSH, T4TOTAL, FREET4, T3FREE, THYROIDAB in  the last 72 hours. Anemia Panel: No results for input(s): VITAMINB12, FOLATE, FERRITIN, TIBC, IRON, RETICCTPCT in the last 72 hours. Urine analysis:    Component Value Date/Time   COLORURINE AMBER (A) 08/06/2016 1930   APPEARANCEUR CLOUDY (A) 08/06/2016 1930   LABSPEC 1.012 08/06/2016 1930   PHURINE 5.5 08/06/2016 1930   GLUCOSEU NEGATIVE 08/06/2016 1930   HGBUR SMALL (A) 08/06/2016 1930   BILIRUBINUR SMALL (A) 08/06/2016 1930   KETONESUR NEGATIVE 08/06/2016 1930   PROTEINUR 30 (A) 08/06/2016 1930   UROBILINOGEN 0.2 01/20/2010 1315   NITRITE NEGATIVE 08/06/2016 1930   LEUKOCYTESUR NEGATIVE 08/06/2016 1930   Sepsis Labs: @LABRCNTIP (procalcitonin:4,lacticidven:4) )No results found for this or any previous visit (from the past 240 hour(s)).   Radiological Exams on Admission: Ct Abdomen Pelvis Wo Contrast  Result Date: 08/06/2016 CLINICAL DATA:  Abdominal pain and bloating. Decreased urine output back. EXAM: CT ABDOMEN AND PELVIS WITHOUT CONTRAST TECHNIQUE: Multidetector CT imaging of the abdomen and pelvis was performed following the standard protocol without IV contrast. COMPARISON:  Renal ultrasound 10/18/2014. FINDINGS: Lower chest: A small right pleural effusion is present. Right greater than left lower lobe airspace disease is present. The heart is enlarged. Coronary artery calcifications are present. No  significant pericardial effusion is present. Hepatobiliary: The liver is somewhat irregular suggesting cirrhosis. No discrete lesions are present. The common bile duct is within normal limits. There is wall thickening and inflammatory change about the gallbladder suggesting acute cholecystitis. There is noted free air or fluid. Pancreas: Within normal limits. Spleen: Normal in size without focal abnormality. Adrenals/Urinary Tract: The adrenal glands are normal bilaterally. Bilateral renal cystic disease is again noted. There is no stone or mass lesion. The central sinus cyst is present on the right. The ureters are within normal limits bilaterally. A Foley catheter is present within the urinary bladder. Stomach/Bowel: The stomach is within normal limits. The duodenum is unremarkable. Small bowel is within normal limits. Inflammatory changes are present about the ascending colon the appendix is visualized and normal. The transverse colon is unremarkable. The descending colon demonstrates diverticula without focal inflammation to suggest diverticulitis. Additional diverticular present throughout the sigmoid colon without focal inflammation. Vascular/Lymphatic: Atherosclerotic calcifications are present in the aorta and branch vessels without aneurysm. No significant adenopathy is present. Reproductive: Calcifications are present within the prostate gland. The prostate gland is markedly enlarged at 7.1 cm in transverse diameter. Other: No free air free fluid is present. Musculoskeletal: Multilevel facet degenerative changes are present. Slight degenerate anterolisthesis is present at L4-5. Leftward curvature of the lumbar spine is centered at L1. There is rightward curvature at L5. Pelvis is intact. The hips are located. IMPRESSION: 1. Wall thickening and diffuse inflammatory changes about the gallbladder suggesting acute cholecystitis. 2. Inflammatory changes about the ascending colon likely represent secondary  reactive change without focal colitis. 3. Sigmoid diverticulosis without diverticulitis. 4. Extensive atherosclerotic change without aneurysm. 5. Marked dilation of the prostate gland. 6. Cardiomegaly. 7. Small right pleural effusion. 8. Mild irregularity of the liver suggesting cirrhosis. 9. Bilateral renal cystic disease. Electronically Signed   By: Marin Roberts M.D.   On: 08/06/2016 20:25    EKG: Independently reviewed. A. fib rate controlled.  Assessment/Plan Principal Problem:   Abdominal pain Active Problems:   Long term current use of anticoagulant therapy   Permanent atrial fibrillation (HCC)   Hypertension   AKI (acute kidney injury) (HCC)   Urinary retention    1. Abdominal pain with urinary obstruction and abnormal CAT scan  concerning for cholecystitis - on exam patient does not have any abdominal tenderness and patient denies any nausea vomiting. I have ordered a HIDA scan and will keep patient in. Except for medications until we get a CAT scan. We'll hold off Coumadin but we will not reverse INR until if any surgical intervention planned. Patient is on empiric antibiotics. 2. Acute renal failure with urinary retention - probably secondary to urinary retention. Patient is on Foley catheter. Gently hydrate. Will need outpatient referral to urologist. Hold lisinopril due to acute renal failure. 3. Permanent atrial fibrillation - continue metoprolol and Cardizem. Holding off Coumadin in anticipation of possible procedure. 4. Hypertension - holding lisinopril due to acute renal failure. When necessary IV hydralazine for systolic blood pressure more than 160. Patient is also on Cardizem and metoprolol. 5. Anemia - patient has mild hematuria in the Foley catheter. Closely follow CBC. 6. CAD status post stenting - denies any chest pain.   DVT prophylaxis: SCDs. Patient is also therapeutic on INR. Code Status: Full code.  Family Communication: Discussed with patient.   Disposition Plan: Home.  Consults called: General surgery.  Admission status: Inpatient.    Eduard Clos MD [facility] Pager [phone].  If 7PM-7AM, please contact night-coverage [email or web address] Password [email or web address]  08/07/2016, 3:10 AM

## 2016-08-07 NOTE — Progress Notes (Signed)
Patient was admitted overnight after midnight and H and P has been reviewed and I am in agreement with the current Assessment and Plan done by Dr. Toniann FailKakrakandy. Patient is a 83 year old Caucasian male with a PMH of CAD s/p Stenting to the LAD and LCX, Hx of NSTEMI, Permanent Atrial Fibrillation, Hx of PE, pulmnonary HTN, HTN, HLD and other comorbidities who [facility] abdominal pain with difficulty urinating over the last 2 weeks. CT was done and showed concern for Acute Cholecystitis so HIDA scan was ordered and General Surgery was consulted. A foley was placed for his urinary retention and patient developed Gross Hematuria so Urology was consulted for evaluation. Of note patient's INR was supratherapeutic so Coumadin was held and INR trending down and is now 4.01. Will continue Empiric Zosyn at this time for wall thickening and diffuse inflammatory changes about the gallbladder suggesting acute cholecystitis and inflammatory changes about the ascending colon likely 2/2 reactive change without focal colitis. Will repeat laboratory work in AM and review HIDA findings. Appreciate General Surgery and Urology evaluation.

## 2016-08-07 NOTE — Consult Note (Signed)
[name] Consult Note   Requesting Attending Physician:  Troy Laughter, DO Service Providing Consult: [name] Consulting Attending: Vernie Ammons, MD  Assessment:  Troy [name] is a 83 y.o. Troy [name] with history of CAD, MI, PE, atrial fib, chronic coagulation therapy on coumadin with supratherapeutic INR 4.3 on admission presents with abdominal pain, n/v with possible acute vs chronic cholecystitis, as well as urinary retention requiring Foley catehterization. Previously followed for BPH/LUTS and BOO with acute urinary retention in 2011. On flomax/finasteride. Gross hematuria started today after Foley placement.  Recommendations: 1. Continue Foley catheter for AUR 3-5 days 2. Irrigate catheter PRN for hematuria and/or clot. No current requirement for CBI - continue to closely monitor. Likely related to supratherapeutic INR. He will benefit from PO or IVF hydration to dilute Troy urine 3. Continue flomax/finasteride 4. Requested that RN staff secure catheter with statlock, trauma via pulling Troy catheter could have theoretically started Troy Troy [name] as well 5. Continue to trend Cr daily  Thank you for this consult. Please contact Troy [name] consult pager with any further questions/concerns. Troy Mullet, MD [name] Resident   HPI -   Reason for Consult:  Gross hematuria, urinary retention  Troy Troy [name] is seen in consultation for reasons noted above at Troy request of Troy Laughter, DO/  This is a 83 yo Troy [name] with a history of CAD, MI, HTN, HLD, PE, atrial fibrillation, BPH/LUTS with urinary retention who presented to ER last evening with lower abdominal pain, n/v, issues with urination. He is on coumadin, ASA 81. By report a catheter was placed in [place] ER and a 'large amount of urine was obtained.' Was unable to void for last 2 days prior to admission. Tells me that his n/v started 2 weeks ago to Troy point where he couldn't keep his meds down, including finasteride/Flomax. Otherwise had  no issues with retention, inability to void, gross hematuria since 2012. His gross hematuria just started today, presumably from catheter irritation of prostate/bladder.  Last seen by Troy Troy [name] 08/2010 with Dr. Aldean [name]. Previously presented to ER 12/2009 in [facility] requiring catheterization. Was previously on flomax q48hrs and finasteride. Lost to follow up thereafter.  History of MI and cardiac stent placement.  Admission labs showed Cr 3.13, supratherapeutic INR 4.34. Also had CT A/P findings suggestive of acute cholecystitis. AFVSS. Urinalysis not suggestive of infection. Cr improving. Last INR 4.0. Developed some GH today.   Past Medical History: Past Medical History:  Diagnosis Date  . Chronic anticoagulation 2011   on coumadin  . Coronary artery disease 2012   PCI-LAD & LCX, Promus Des  . Echocardiogram abnormal 11/07/2010   EF 50-55%, LA mild to mod dilated, mod MR, RV SYSTOLIC PRESSure 40-50 mild-mod pul. HTN   . H/O cardiovascular stress test 11/07/2010   new scar in LCX distribution  . Hyperlipidemia LDL goal < 70   . Hypertension   . Myocardial infarction 05/23/2010   non-ST elevation MI  . Permanent atrial fibrillation (HCC)   . Pulmonary embolus (HCC) 05/23/2010  . Pulmonary hypertension    mild to moderate    Past Surgical History:  Past Surgical History:  Procedure Laterality Date  . CARDIAC CATHETERIZATION  05/23/10   high grade LAD and circumflex disease, EF 50% stented circumflex w/ Promus drug-eluding stent  . CARDIAC CATHETERIZATION  05/28/2010   LAD sten w/ Promus drug eluding stent  . CORONARY ANGIOPLASTY WITH STENT PLACEMENT      Medication: Current Facility-Administered Medications  Medication Dose Route Frequency Provider Last  Rate Last Dose  . 0.9 %  sodium chloride infusion   Intravenous Continuous Troy Clos, MD 75 mL/hr at 03/Troy/18 0315    . acetaminophen (TYLENOL) tablet 650 mg  650 mg Oral Q6H PRN Troy Clos, MD       Or   . acetaminophen (TYLENOL) suppository 650 mg  650 mg Rectal Q6H PRN Troy Clos, MD      . diltiazem (CARDIZEM CD) 24 hr capsule 120 mg  120 mg Oral Daily Troy Clos, MD   Stopped at 03/Troy/18 1000  . febuxostat (ULORIC) tablet 40 mg  40 mg Oral Daily Troy Clos, MD   Stopped at 03/Troy/18 1000  . finasteride (PROSCAR) tablet 5 mg  5 mg Oral Daily Troy Clos, MD   Stopped at 03/Troy/18 1000  . latanoprost (XALATAN) 0.005 % ophthalmic solution 1 drop  1 drop Both Eyes QHS Troy Clos, MD      . levothyroxine (SYNTHROID, LEVOTHROID) tablet 50 mcg  50 mcg Oral QAC breakfast Troy Clos, MD   50 mcg at 03/Troy/18 0746  . metoprolol (LOPRESSOR) tablet 50 mg  50 mg Oral BID Troy Clos, MD   Stopped at 03/Troy/18 1000  . nitroGLYCERIN (NITROSTAT) SL tablet 0.4 mg  0.4 mg Sublingual Q5 min PRN Troy Clos, MD      . pantoprazole (PROTONIX) EC tablet 40 mg  40 mg Oral Daily Troy Clos, MD   Stopped at 03/Troy/18 1000  . piperacillin-tazobactam (ZOSYN) IVPB 2.25 g  2.25 g Intravenous Q6H Troy Clos, MD   2.25 g at 03/Troy/18 1451  . pravastatin (PRAVACHOL) tablet 40 mg  40 mg Oral Daily Troy Clos, MD   Stopped at 03/Troy/18 1000  . predniSONE (DELTASONE) tablet 5 mg  5 mg Oral Daily Troy Clos, MD   Stopped at 03/Troy/18 1000  . tamsulosin (FLOMAX) capsule 0.4 mg  0.4 mg Oral QHS Troy Clos, MD        Allergies: No Known Allergies  Social History: Social History  Substance Use Topics  . Smoking status: Former Smoker    Quit date: 09/27/1973  . Smokeless tobacco: Never Used  . Alcohol use No    Family History Family History  Problem Relation Age of Onset  . Family history unknown: Yes    Review of Systems 10 systems were reviewed and are negative except as noted specifically in Troy HPI.  Objective   Vital signs in last 24 hours: BP (!) 141/78 (BP Location: Right Arm)   Pulse 100   Temp 98 F  (36.7 C) (Oral)   Resp 18   Ht 5\' 8"  (1.727 m)   Wt 68.9 kg (151 lb 14.4 oz)   SpO2 97%   BMI 23.10 kg/m   Intake/Output last 3 shifts: I/O last 3 completed shifts: In: 306.3 [I.V.:206.3; IV Piggyback:100] Out: 1150 [Urine:1150]  Physical Exam General: NAD, A&O, resting, appropriate HEENT: Ward/AT, EOMI, MMM Pulmonary: Normal work of breathing on RA Cardiovascular: Regular rate & rhythm, HDS, adequate peripheral perfusion Abdomen: soft, NTTP, nondistended, no suprapubic fullness or tenderness GU: Foley catheter draining transparent merlot colored urine easily flushed down to a light watermelon with production of minimal clot, uncircumcised Troy [name] phallus, moderate blood at urethral meatus and on clothing overlying penis DRE: deferred Extremities: warm and well perfused, no edema   Most Recent Labs: Lab Results  Component Value Date   WBC 7.1 03/Troy/2018   HGB 9.3 (  L) 03/Troy/2018   HCT 28.9 (L) 03/Troy/2018   PLT 143 (L) 03/Troy/2018    Lab Results  Component Value Date   NA 141 03/Troy/2018   K 3.5 03/Troy/2018   CL 106 03/Troy/2018   CO2 26 03/Troy/2018   BUN 55 (H) 03/Troy/2018   CREATININE 2.69 (H) 03/Troy/2018   CALCIUM 8.6 (L) 03/Troy/2018   MG 1.9 05/23/2010    Lab Results  Component Value Date   ALKPHOS 104 03/Troy/2018   BILITOT 2.9 (H) 03/Troy/2018   PROT 6.1 (L) 03/Troy/2018   ALBUMIN 2.9 (L) 03/Troy/2018   ALT 41 03/Troy/2018   [name] 42 (H) 03/Troy/2018    Lab Results  Component Value Date   INR 4.01 (HH) 03/Troy/2018   APTT (H) 05/23/2010    193        IF BASELINE aPTT IS ELEVATED, SUGGEST Troy [name] RISK ASSESSMENT BE USED TO DETERMINE APPROPRIATE ANTICOAGULANT THERAPY.     Urine Culture: None   IMAGING: Ct Abdomen Pelvis Wo Contrast  Result Date: 08/06/2016 CLINICAL DATA:  Abdominal pain and bloating. Decreased urine output back. EXAM: CT ABDOMEN AND PELVIS WITHOUT CONTRAST TECHNIQUE: Multidetector CT imaging of Troy abdomen and pelvis was performed following Troy standard  protocol without IV contrast. COMPARISON:  Renal ultrasound 10/18/2014. FINDINGS: Lower chest: A small right pleural effusion is present. Right greater than left lower lobe airspace disease is present. Troy heart is enlarged. Coronary artery calcifications are present. No significant pericardial effusion is present. Hepatobiliary: Troy liver is somewhat irregular suggesting cirrhosis. No discrete lesions are present. Troy common bile duct is within normal limits. There is wall thickening and inflammatory change about Troy gallbladder suggesting acute cholecystitis. There is noted free air or fluid. Pancreas: Within normal limits. Spleen: Normal in size without focal abnormality. Adrenals/Urinary Tract: Troy adrenal glands are normal bilaterally. Bilateral renal cystic disease is again noted. There is no stone or mass lesion. Troy central sinus cyst is present on Troy right. Troy ureters are within normal limits bilaterally. A Foley catheter is present within Troy urinary bladder. Stomach/Bowel: Troy stomach is within normal limits. Troy duodenum is unremarkable. Small bowel is within normal limits. Inflammatory changes are present about Troy ascending colon Troy appendix is visualized and normal. Troy transverse colon is unremarkable. Troy descending colon demonstrates diverticula without focal inflammation to suggest diverticulitis. Additional diverticular present throughout Troy sigmoid colon without focal inflammation. Vascular/Lymphatic: Atherosclerotic calcifications are present in Troy aorta and branch vessels without aneurysm. No significant adenopathy is present. Reproductive: Calcifications are present within Troy prostate gland. Troy prostate gland is markedly enlarged at 7.1 cm in transverse diameter. Other: No free air free fluid is present. Musculoskeletal: Multilevel facet degenerative changes are present. Slight degenerate anterolisthesis is present at L4-5. Leftward curvature of Troy lumbar spine is centered at L1.  There is rightward curvature at L5. Pelvis is intact. Troy hips are located. IMPRESSION: 1. Wall thickening and diffuse inflammatory changes about Troy gallbladder suggesting acute cholecystitis. 2. Inflammatory changes about Troy ascending colon likely represent secondary reactive change without focal colitis. 3. Sigmoid diverticulosis without diverticulitis. 4. Extensive atherosclerotic change without aneurysm. 5. Marked dilation of Troy prostate gland. 6. Cardiomegaly. 7. Small right pleural effusion. 8. Mild irregularity of Troy liver suggesting cirrhosis. 9. Bilateral renal cystic disease. Electronically Signed   By: Marin Roberts M.D.   On: 08/06/2016 20:25   Nm Hepato W/eject Fract  Result Date: 3/Troy/2018 CLINICAL DATA:  83 year old Troy [name] with circumferential gallbladder wall thickening on recent CT Abdomen and Pelvis. Elevated  bilirubin. Abdominal pain. EXAM: NUCLEAR MEDICINE HEPATOBILIARY IMAGING WITH GALLBLADDER EF TECHNIQUE: Sequential images of Troy abdomen were obtained out to 60 minutes following intravenous administration of radiopharmaceutical. After slow intravenous infusion of 1.4 micrograms Cholecystokinin, gallbladder ejection fraction was determined. RADIOPHARMACEUTICALS:  7.8 mCi Tc-3939m Choletec IV COMPARISON:  CT Abdomen and Pelvis 08/06/2016 FINDINGS: Prompt radiotracer uptake by Troy liver. Mildly slow clearance from Troy blood pool. CBD and small bowel activity visible by 40 minutes. Probable faint gallbladder activity on Troy 30 minutes image, but was not definitive. After an additional 30 minutes, and beginning at 70 minutes, gallbladder activity was confidently identified along Troy inferior margin of Troy right liver. Small bowel activity increased over Troy course of Troy study. Following CCK administration Troy gallbladder activity did wax and wane indicating some gallbladder emptying, but despite multiple attempts a valid ejection fraction could not be determined. Troy Troy [name] was not  symptomatic during or after CCK infusion. IMPRESSION: 1. Patent CBD without evidence of biliary ductal obstruction. 2. Suspect Chronic Cholecystitis as delayed gallbladder filling was observed. Waxing and waning gallbladder activity such that a valid ejection fraction could not be determined. Electronically Signed   By: Odessa FlemingH  Hall M.D.   On: 03/Troy/2018 Troy:09    Troy [name] was seen, examined,treatment plan was discussed with Troy resident.  I have directly reviewed Troy clinical findings, lab, imaging studies and management of this Troy [name] in detail. I have made Troy necessary changes and/or additions to Troy above noted documentation, and agree with Troy documentation, as recorded by Troy resident.

## 2016-08-07 NOTE — Progress Notes (Signed)
Taking over care of patient, agree with previous RN assessment. Denies any needs at this time. Will continue to monitor.  

## 2016-08-07 NOTE — Progress Notes (Signed)
CRITICAL VALUE ALERT  Critical value received: INR= 4.13  Date of notification:  08/07/16 Time of notification:  0739  Critical value read back:Yes.    Nurse who received alert:  J. Deutsch,RN/ D.Hue Frick,RN  MD notified (1st page):  Dr. Marland McalpineSheikh (via text page)  Time of first page:  [phone]  MD notified (2nd page):  Time of second page:  Responding MD:    Time MD responded:

## 2016-08-08 ENCOUNTER — Inpatient Hospital Stay (HOSPITAL_COMMUNITY): Payer: Medicare Other

## 2016-08-08 DIAGNOSIS — R109 Unspecified abdominal pain: Secondary | ICD-10-CM

## 2016-08-08 DIAGNOSIS — R310 Gross hematuria: Secondary | ICD-10-CM

## 2016-08-08 LAB — GLUCOSE, CAPILLARY
GLUCOSE-CAPILLARY: 121 mg/dL — AB (ref 65–99)
Glucose-Capillary: 113 mg/dL — ABNORMAL HIGH (ref 65–99)
Glucose-Capillary: 78 mg/dL (ref 65–99)

## 2016-08-08 LAB — CBC WITH DIFFERENTIAL/PLATELET
BASOS ABS: 0 10*3/uL (ref 0.0–0.1)
Basophils Relative: 0 %
EOS ABS: 0.1 10*3/uL (ref 0.0–0.7)
EOS PCT: 1 %
HCT: 30.5 % — ABNORMAL LOW (ref 39.0–52.0)
HEMOGLOBIN: 9.9 g/dL — AB (ref 13.0–17.0)
Lymphocytes Relative: 10 %
Lymphs Abs: 0.6 10*3/uL — ABNORMAL LOW (ref 0.7–4.0)
MCH: 28 pg (ref 26.0–34.0)
MCHC: 32.5 g/dL (ref 30.0–36.0)
MCV: 86.4 fL (ref 78.0–100.0)
Monocytes Absolute: 0.8 10*3/uL (ref 0.1–1.0)
Monocytes Relative: 13 %
NEUTROS PCT: 76 %
Neutro Abs: 4.3 10*3/uL (ref 1.7–7.7)
PLATELETS: 157 10*3/uL (ref 150–400)
RBC: 3.53 MIL/uL — AB (ref 4.22–5.81)
RDW: 15.4 % (ref 11.5–15.5)
WBC: 5.7 10*3/uL (ref 4.0–10.5)

## 2016-08-08 LAB — COMPREHENSIVE METABOLIC PANEL
ALBUMIN: 2.6 g/dL — AB (ref 3.5–5.0)
ALT: 33 U/L (ref 17–63)
AST: 31 U/L (ref 15–41)
Alkaline Phosphatase: 110 U/L (ref 38–126)
Anion gap: 7 (ref 5–15)
BUN: 43 mg/dL — AB (ref 6–20)
CHLORIDE: 108 mmol/L (ref 101–111)
CO2: 26 mmol/L (ref 22–32)
CREATININE: 2.25 mg/dL — AB (ref 0.61–1.24)
Calcium: 8.5 mg/dL — ABNORMAL LOW (ref 8.9–10.3)
GFR calc Af Amer: 27 mL/min — ABNORMAL LOW (ref >60)
GFR calc non Af Amer: 23 mL/min — ABNORMAL LOW (ref >60)
GLUCOSE: 80 mg/dL (ref 65–99)
Potassium: 3.6 mmol/L (ref 3.5–5.1)
SODIUM: 141 mmol/L (ref 135–145)
Total Bilirubin: 2.7 mg/dL — ABNORMAL HIGH (ref 0.3–1.2)
Total Protein: 6.1 g/dL — ABNORMAL LOW (ref 6.5–8.1)

## 2016-08-08 LAB — PHOSPHORUS: PHOSPHORUS: 2.4 mg/dL — AB (ref 2.5–4.6)

## 2016-08-08 LAB — PROTIME-INR
INR: 3.62
Prothrombin Time: 36.9 seconds — ABNORMAL HIGH (ref 11.4–15.2)

## 2016-08-08 LAB — MAGNESIUM: Magnesium: 1.9 mg/dL (ref 1.7–2.4)

## 2016-08-08 MED ORDER — PHENOL 1.4 % MT LIQD
1.0000 | OROMUCOSAL | Status: DC | PRN
  Filled 2016-08-08: qty 177

## 2016-08-08 MED ORDER — SODIUM CHLORIDE 0.9 % IV SOLN
INTRAVENOUS | Status: DC
  Administered 2016-08-08 – 2016-08-10 (×2): via INTRAVENOUS

## 2016-08-08 MED ORDER — TAMSULOSIN HCL 0.4 MG PO CAPS
0.8000 mg | ORAL_CAPSULE | Freq: Every day | ORAL | Status: DC
  Administered 2016-08-08 – 2016-08-13 (×6): 0.8 mg via ORAL
  Filled 2016-08-08 (×6): qty 2

## 2016-08-08 NOTE — Progress Notes (Signed)
General Surgery [facility]  Assessment & Plan:  HD#3 - cholecystitis, possible colitis  USN of gallbladder pending this AM - evaluate for cholelithiasis  HIDA with delayed visualization consistent with chronic cholecystitis, no obstruction  NPO for USN this AM, then resume soft diet from my standpoint  Will follow up later today.  No plans for operative intervention or perc drainage at present.  Clinically improving with urologic interventions.        Velora Heckler, MD, [facility]       Office: [phone]    Subjective: Patient in bed, family at bedside.  No complaints except for being NPO.  Denies abd pain.  Denies nausea or emesis.  Objective: Vital signs in last 24 hours: Temp:  [98 F (36.7 C)-99.2 F (37.3 C)] 99.2 F (37.3 C) (03/16 0625) Pulse Rate:  [86-100] 94 (03/16 0625) Resp:  [16-18] 16 (03/16 0625) BP: (107-141)/(64-90) 107/64 (03/16 0625) SpO2:  [95 %-97 %] 95 % (03/16 0625) Weight:  [71.9 kg (158 lb 8.2 oz)] 71.9 kg (158 lb 8.2 oz) (03/16 0625) Last BM Date: 08/06/16  Intake/Output from previous day: 03/15 0701 - 03/16 0700 In: 1050 [I.V.:900; IV Piggyback:150] Out: 600 [Urine:600] Intake/Output this shift: No intake/output data recorded.  Physical Exam: HEENT - sclerae clear, mucous membranes moist Abdomen - soft without distension; non-tender; no mass Neuro - alert & oriented, no focal deficits  Lab Results:   Recent Labs  08/07/16 0536 08/08/16 0513  WBC 7.1 5.7  HGB 9.3* 9.9*  HCT 28.9* 30.5*  PLT 143* 157   BMET  Recent Labs  08/07/16 0536 08/08/16 0513  NA 141 141  K 3.5 3.6  CL 106 108  CO2 26 26  GLUCOSE 88 80  BUN 55* 43*  CREATININE 2.69* 2.25*  CALCIUM 8.6* 8.5*   PT/INR  Recent Labs  08/07/16 0704 08/08/16 0513  LABPROT 40.1* 36.9*  INR 4.01* 3.62   Comprehensive Metabolic Panel:    Component Value Date/Time   NA 141 08/08/2016 0513   NA 141  08/07/2016 0536   K 3.6 08/08/2016 0513   K 3.5 08/07/2016 0536   CL 108 08/08/2016 0513   CL 106 08/07/2016 0536   CO2 26 08/08/2016 0513   CO2 26 08/07/2016 0536   BUN 43 (H) 08/08/2016 0513   BUN 55 (H) 08/07/2016 0536   CREATININE 2.25 (H) 08/08/2016 0513   CREATININE 2.69 (H) 08/07/2016 0536   GLUCOSE 80 08/08/2016 0513   GLUCOSE 88 08/07/2016 0536   CALCIUM 8.5 (L) 08/08/2016 0513   CALCIUM 8.6 (L) 08/07/2016 0536   AST 31 08/08/2016 0513   AST 42 (H) 08/07/2016 0536   ALT 33 08/08/2016 0513   ALT 41 08/07/2016 0536   ALKPHOS 110 08/08/2016 0513   ALKPHOS 104 08/07/2016 0536   BILITOT 2.7 (H) 08/08/2016 0513   BILITOT 2.9 (H) 08/07/2016 0536   PROT 6.1 (L) 08/08/2016 0513   PROT 6.1 (L) 08/07/2016 0536   ALBUMIN 2.6 (L) 08/08/2016 0513   ALBUMIN 2.9 (L) 08/07/2016 0536    Studies/Results: Ct Abdomen Pelvis Wo Contrast  Result Date: 08/06/2016 CLINICAL DATA:  Abdominal pain and bloating. Decreased urine output back. EXAM: CT ABDOMEN AND PELVIS WITHOUT CONTRAST TECHNIQUE: Multidetector CT imaging of the abdomen and pelvis was performed following the standard protocol without IV contrast. COMPARISON:  Renal ultrasound 10/18/2014. FINDINGS: Lower chest: A small right pleural effusion is present. Right  greater than left lower lobe airspace disease is present. The heart is enlarged. Coronary artery calcifications are present. No significant pericardial effusion is present. Hepatobiliary: The liver is somewhat irregular suggesting cirrhosis. No discrete lesions are present. The common bile duct is within normal limits. There is wall thickening and inflammatory change about the gallbladder suggesting acute cholecystitis. There is noted free air or fluid. Pancreas: Within normal limits. Spleen: Normal in size without focal abnormality. Adrenals/Urinary Tract: The adrenal glands are normal bilaterally. Bilateral renal cystic disease is again noted. There is no stone or mass lesion. The  central sinus cyst is present on the right. The ureters are within normal limits bilaterally. A Foley catheter is present within the urinary bladder. Stomach/Bowel: The stomach is within normal limits. The duodenum is unremarkable. Small bowel is within normal limits. Inflammatory changes are present about the ascending colon the appendix is visualized and normal. The transverse colon is unremarkable. The descending colon demonstrates diverticula without focal inflammation to suggest diverticulitis. Additional diverticular present throughout the sigmoid colon without focal inflammation. Vascular/Lymphatic: Atherosclerotic calcifications are present in the aorta and branch vessels without aneurysm. No significant adenopathy is present. Reproductive: Calcifications are present within the prostate gland. The prostate gland is markedly enlarged at 7.1 cm in transverse diameter. Other: No free air free fluid is present. Musculoskeletal: Multilevel facet degenerative changes are present. Slight degenerate anterolisthesis is present at L4-5. Leftward curvature of the lumbar spine is centered at L1. There is rightward curvature at L5. Pelvis is intact. The hips are located. IMPRESSION: 1. Wall thickening and diffuse inflammatory changes about the gallbladder suggesting acute cholecystitis. 2. Inflammatory changes about the ascending colon likely represent secondary reactive change without focal colitis. 3. Sigmoid diverticulosis without diverticulitis. 4. Extensive atherosclerotic change without aneurysm. 5. Marked dilation of the prostate gland. 6. Cardiomegaly. 7. Small right pleural effusion. 8. Mild irregularity of the liver suggesting cirrhosis. 9. Bilateral renal cystic disease. Electronically Signed   By: Marin Robertshristopher  Mattern M.D.   On: 08/06/2016 20:25   Nm Hepato W/eject Fract  Result Date: 08/07/2016 CLINICAL DATA:  83 year old male with circumferential gallbladder wall thickening on recent CT Abdomen and  Pelvis. Elevated bilirubin. Abdominal pain. EXAM: NUCLEAR MEDICINE HEPATOBILIARY IMAGING WITH GALLBLADDER EF TECHNIQUE: Sequential images of the abdomen were obtained out to 60 minutes following intravenous administration of radiopharmaceutical. After slow intravenous infusion of 1.4 micrograms Cholecystokinin, gallbladder ejection fraction was determined. RADIOPHARMACEUTICALS:  7.8 mCi Tc-5814m Choletec IV COMPARISON:  CT Abdomen and Pelvis 08/06/2016 FINDINGS: Prompt radiotracer uptake by the liver. Mildly slow clearance from the blood pool. CBD and small bowel activity visible by 40 minutes. Probable faint gallbladder activity on the 30 minutes image, but was not definitive. After an additional 30 minutes, and beginning at 70 minutes, gallbladder activity was confidently identified along the inferior margin of the right liver. Small bowel activity increased over the course of the study. Following CCK administration the gallbladder activity did wax and wane indicating some gallbladder emptying, but despite multiple attempts a valid ejection fraction could not be determined. The patient was not symptomatic during or after CCK infusion. IMPRESSION: 1. Patent CBD without evidence of biliary ductal obstruction. 2. Suspect Chronic Cholecystitis as delayed gallbladder filling was observed. Waxing and waning gallbladder activity such that a valid ejection fraction could not be determined. Electronically Signed   By: Odessa FlemingH  Hall M.D.   On: 08/07/2016 15:09      Keshonna Valvo Judie PetitM 08/08/2016

## 2016-08-08 NOTE — Progress Notes (Signed)
PROGRESS NOTE    Troy [name]  ZOX:[id number] DOB: [birth year] DOA: 08/06/2016 PCP: Pearson Grippe, MD   Brief Narrative:  Troy [name] is a 83 y.o. male with history of CAD status post stenting, atrial fibrillation, history of PE, history of hypertension hyperlipidemia and other comorbids presents to the ER because of increasing lower abdominal pain with difficulty urinating. Denies fever chills diarrhea nausea vomiting currently but daughter stated he was vomiting PTA. Had CT of Abdomen done which showed ? Colitis so General Surgery was consulted and Foley catheter was placed for his acute Urinary Retention and patient ended up with Hematuria so Urology was consulted. INR was supratherapeutic on Admission. Patient feeling well and has no complaints currently.   Assessment & Plan:   Principal Problem:   Abdominal pain Active Problems:   Long term current use of anticoagulant therapy   Permanent atrial fibrillation (HCC)   Hypertension   AKI (acute kidney injury) (HCC)   Urinary retention  Lower Abdominal pain with Urinary obstruction and Retention -S/p Insertion of Foley and now has Hematuria -Urology Following and Appreciate Recc's -C/w Foley Catheter Drainage and Hand Irrigation as necessary -Await Decrease in PT/INR with clearance of urine prior to Voiding Trial -C/w Finasteride 5 mg po Daily and with Tamsulosin 0.8 mg Daily  Gross Hematuria -As above -INR is decreasing and now 3.62  ? Cholecystitis -Had N/V prior to Admission -Abnormal CAT scan concerning for cholecystitis as showes wall thickening and diffuse inflammatory changes about the gallbladder suggesting acute cholecystitis- on exam patient does not have any abdominal tenderness and patient denies any nausea vomiting. -HIDA Scan ordered and showes Patent CBD without evidence of biliary ductal obstructuion and suspect chronic cholecystitis as delayed gallbladder filling was observed and waxing and waning gallbladder  activity such that a valid EF could not be determined -Ultrasound of GallBladder Showed Gallstones with gallbladder wall thickening and equivocal pericholecystic fluid. These are findings indicative of a degree of cholecystitis, quite possibly chronic based on hepatobiliary imaging study appearance 1 day prior. -General Surgery Consulted and Following and appreciate Recc's -Per Dr. Ardine Eng note Surgery will follow up later today and no plans for operative intervention or perc drainage at present -C/w IV Zosyn for Empiric Coverage -Patient Afebrile and has no WBC (5.7) -Follow General Surgery Recc's  Acute renal failure with urinary retention  -probably secondary to urinary retention.  -Patient is on Foley catheter.  -Gently hydrate with NS at 75 mL/hr.  -Hold lisinopril due to acute renal failure. -BUN/Cr improving and went from 55/2.69 -> 43/2.25 -Repeat CMP in AM  Permanent Atrial Fbrillation  -Continue Metoprolol 50 mg po BID and Cardizem 120 mg po Daily.  -Holding off Coumadin in anticipation of possible procedure and because INR was supratherapeutic (went from 4.01 -> 3.62) -C/w Telemetry  Hypertension  -C/w Metoprolol 50 mg po BID and Cardizem 120 mg po Daily.  -Holding lisinopril due to acute renal failure.  -When necessary IV hydralazine for systolic blood pressure more than 160. Patient is also on Cardizem and metoprolol.  Normocytic Anemia  -Hb/Hct went from 55/2.69 -> 43/2.25 -Hematuria in the Foley catheter improving.  -Closely follow CBC.  CAD status post stenting  -denies any chest pain. -C/w NTG 0.4 mg SL q45minPRN -C/w Metoprolol 50 mg po BID  Hypothyroidism -C/w Levothyroxine 50 mcg Po Daily  Hx of PE -Coumadin Held because of Supratherapeutic INR  DVT prophylaxis: SCD's; INR was supratherapetic on Coumadin Code Status: FULL CODE Family Communication: Discussed with  Daughter at bedside Disposition Plan: Will obtain PT Evaluation  Consultants:    General Surgery  Urology   Procedures: HIDA Scan and Ultrasound of Abdomen  Antimicrobials:  Anti-infectives    Start     Dose/Rate Route Frequency Ordered Stop   08/07/16 0600  piperacillin-tazobactam (ZOSYN) IVPB 2.25 g     2.25 g 100 mL/hr over 30 Minutes Intravenous Every 6 hours 08/07/16 0332     08/06/16 2245  piperacillin-tazobactam (ZOSYN) IVPB 3.375 g     3.375 g 100 mL/hr over 30 Minutes Intravenous  Once 08/06/16 2234 08/07/16 0021     Subjective: Seen and examined and had no complaints but wanted to eat. No CP or SOB. No Abdominal Pain. Thinks blood in urine is decreasing.   Objective: Vitals:   08/07/16 1510 08/07/16 2111 08/08/16 0625 08/08/16 1330  BP: (!) 141/78 128/90 107/64 110/75  Pulse: 100 86 94 89  Resp: Temp: 98 F (36.7 C) 98.3 F (36.8 C) 99.2 F (37.3 C) 98.9 F (37.2 C)  TempSrc: Oral Oral Oral Oral  SpO2: 97% 95% 95% 96%  Weight:   71.9 kg (158 lb 8.2 oz)   Height:        Intake/Output Summary (Last 24 hours) at 08/08/16 1449 Last data filed at 08/08/16 1610  Gross per 24 hour  Intake              450 ml  Output              600 ml  Net             -150 ml   Filed Weights   08/07/16 0126 08/07/16 0538 08/08/16 0625  Weight: 68.1 kg (150 lb 1.6 oz) 68.9 kg (151 lb 14.4 oz) 71.9 kg (158 lb 8.2 oz)   Examination: Physical Exam:  Constitutional: Elderly gentleman appears younger than stated age in NAD and appears calm and comfortable Eyes: Lids and conjunctivae normal, sclerae anicteric  ENMT: External Ears, Nose appear normal. Grossly normal hearing.  Neck: Appears normal, supple, no cervical masses, normal ROM, no appreciable thyromegaly Respiratory: Diminished to auscultation bilaterally, no wheezing, rales, rhonchi or crackles. Normal respiratory effort and patient is not tachypenic. No accessory muscle use.  Cardiovascular: Irregularly Irregular, no murmurs / rubs / gallops. S1 and S2 auscultated. No extremity  edema noted. Abdomen: Soft, non-tender, non-distended. No masses palpated. No appreciable hepatosplenomegaly. Bowel sounds positive x4.  GU: Deferred. Foley in place draining red and dark urine Musculoskeletal: No clubbing / cyanosis of digits/nails. No joint deformity upper and lower extremities.   Skin: No rashes, lesions, ulcers. No induration; Warm and dry.  Neurologic: CN 2-12 grossly intact with no focal deficits. Sensation intact in all 4 Extremities. Romberg sign cerebellar reflexes not assessed.  Psychiatric: Normal judgment and insight. Alert and oriented x 3. Normal mood and appropriate affect.   Data Reviewed: I have personally reviewed following labs and imaging studies  CBC:  Recent Labs Lab 08/06/16 1824 08/07/16 0536 08/08/16 0513  WBC 8.8 7.1 5.7  NEUTROABS 7.3 5.8 4.3  HGB 10.3* 9.3* 9.9*  HCT 32.3* 28.9* 30.5*  MCV 87.5 83.8 86.4  PLT 145* 143* 157   Basic Metabolic Panel:  Recent Labs Lab 08/06/16 1824 08/07/16 0536 08/08/16 0513  NA 134* 141 141  K 3.6 3.5 3.6  CL 98* 106 108  CO2 GLUCOSE 181* 88 80  BUN 57* 55* 43*  CREATININE 3.13* 2.69*  2.25*  CALCIUM 8.6* 8.6* 8.5*  MG  --   --  1.9  PHOS  --   --  2.4*   GFR: Estimated Creatinine Clearance: 19.8 mL/min (A) (by C-G formula based on SCr of 2.25 mg/dL (H)). Liver Function Tests:  Recent Labs Lab 08/06/16 1824 08/07/16 0536 08/08/16 0513  AST 66* 42* 31  ALT 52 41 33  ALKPHOS 123 104 110  BILITOT 3.9* 2.9* 2.7*  PROT 7.2 6.1* 6.1*  ALBUMIN 3.4* 2.9* 2.6*   No results for input(s): LIPASE, AMYLASE in the last 168 hours. No results for input(s): AMMONIA in the last 168 hours. Coagulation Profile:  Recent Labs Lab 08/06/16 1824 08/07/16 0536 08/07/16 0704 08/08/16 0513  INR 4.34* 4.13* 4.01* 3.62   Cardiac Enzymes: No results for input(s): CKTOTAL, CKMB, CKMBINDEX, TROPONINI in the last 168 hours. BNP (last 3 results) No results for input(s): PROBNP in the last 8760  hours. HbA1C: No results for input(s): HGBA1C in the last 72 hours. CBG:  Recent Labs Lab 08/07/16 1721 08/07/16 1812 08/08/16 0000 08/08/16 0036 08/08/16 0802  GLUCAP 63* 98 66 113* 78   Lipid Profile: No results for input(s): CHOL, HDL, LDLCALC, TRIG, CHOLHDL, LDLDIRECT in the last 72 hours. Thyroid Function Tests: No results for input(s): TSH, T4TOTAL, FREET4, T3FREE, THYROIDAB in the last 72 hours. Anemia Panel: No results for input(s): VITAMINB12, FOLATE, FERRITIN, TIBC, IRON, RETICCTPCT in the last 72 hours. Sepsis Labs: No results for input(s): PROCALCITON, LATICACIDVEN in the last 168 hours.  No results found for this or any previous visit (from the past 240 hour(s)).   Radiology Studies: Ct Abdomen Pelvis Wo Contrast  Result Date: 08/06/2016 CLINICAL DATA:  Abdominal pain and bloating. Decreased urine output back. EXAM: CT ABDOMEN AND PELVIS WITHOUT CONTRAST TECHNIQUE: Multidetector CT imaging of the abdomen and pelvis was performed following the standard protocol without IV contrast. COMPARISON:  Renal ultrasound 10/18/2014. FINDINGS: Lower chest: A small right pleural effusion is present. Right greater than left lower lobe airspace disease is present. The heart is enlarged. Coronary artery calcifications are present. No significant pericardial effusion is present. Hepatobiliary: The liver is somewhat irregular suggesting cirrhosis. No discrete lesions are present. The common bile duct is within normal limits. There is wall thickening and inflammatory change about the gallbladder suggesting acute cholecystitis. There is noted free air or fluid. Pancreas: Within normal limits. Spleen: Normal in size without focal abnormality. Adrenals/Urinary Tract: The adrenal glands are normal bilaterally. Bilateral renal cystic disease is again noted. There is no stone or mass lesion. The central sinus cyst is present on the right. The ureters are within normal limits bilaterally. A Foley  catheter is present within the urinary bladder. Stomach/Bowel: The stomach is within normal limits. The duodenum is unremarkable. Small bowel is within normal limits. Inflammatory changes are present about the ascending colon the appendix is visualized and normal. The transverse colon is unremarkable. The descending colon demonstrates diverticula without focal inflammation to suggest diverticulitis. Additional diverticular present throughout the sigmoid colon without focal inflammation. Vascular/Lymphatic: Atherosclerotic calcifications are present in the aorta and branch vessels without aneurysm. No significant adenopathy is present. Reproductive: Calcifications are present within the prostate gland. The prostate gland is markedly enlarged at 7.1 cm in transverse diameter. Other: No free air free fluid is present. Musculoskeletal: Multilevel facet degenerative changes are present. Slight degenerate anterolisthesis is present at L4-5. Leftward curvature of the lumbar spine is centered at L1. There is rightward curvature at L5. Pelvis is intact.  The hips are located. IMPRESSION: 1. Wall thickening and diffuse inflammatory changes about the gallbladder suggesting acute cholecystitis. 2. Inflammatory changes about the ascending colon likely represent secondary reactive change without focal colitis. 3. Sigmoid diverticulosis without diverticulitis. 4. Extensive atherosclerotic change without aneurysm. 5. Marked dilation of the prostate gland. 6. Cardiomegaly. 7. Small right pleural effusion. 8. Mild irregularity of the liver suggesting cirrhosis. 9. Bilateral renal cystic disease. Electronically Signed   By: Marin Roberts M.D.   On: 08/06/2016 20:25   Nm Hepato W/eject Fract  Result Date: 08/07/2016 CLINICAL DATA:  83 year old male with circumferential gallbladder wall thickening on recent CT Abdomen and Pelvis. Elevated bilirubin. Abdominal pain. EXAM: NUCLEAR MEDICINE HEPATOBILIARY IMAGING WITH GALLBLADDER  EF TECHNIQUE: Sequential images of the abdomen were obtained out to 60 minutes following intravenous administration of radiopharmaceutical. After slow intravenous infusion of 1.4 micrograms Cholecystokinin, gallbladder ejection fraction was determined. RADIOPHARMACEUTICALS:  7.8 mCi Tc-31m Choletec IV COMPARISON:  CT Abdomen and Pelvis 08/06/2016 FINDINGS: Prompt radiotracer uptake by the liver. Mildly slow clearance from the blood pool. CBD and small bowel activity visible by 40 minutes. Probable faint gallbladder activity on the 30 minutes image, but was not definitive. After an additional 30 minutes, and beginning at 70 minutes, gallbladder activity was confidently identified along the inferior margin of the right liver. Small bowel activity increased over the course of the study. Following CCK administration the gallbladder activity did wax and wane indicating some gallbladder emptying, but despite multiple attempts a valid ejection fraction could not be determined. The patient was not symptomatic during or after CCK infusion. IMPRESSION: 1. Patent CBD without evidence of biliary ductal obstruction. 2. Suspect Chronic Cholecystitis as delayed gallbladder filling was observed. Waxing and waning gallbladder activity such that a valid ejection fraction could not be determined. Electronically Signed   By: Odessa Fleming M.D.   On: 08/07/2016 15:09   US Abdomen Limited Ruq  Result Date: 08/08/2016 CLINICAL DATA:  Right upper quadrant pain EXAM: US ABDOMEN LIMITED - RIGHT UPPER QUADRANT COMPARISON:  Nuclear medicine hepatobiliary imaging study August 07, 2016 and CT abdomen and pelvis August 06, 2016 FINDINGS: Gallbladder: Within the gallbladder, there are echogenic foci which move and shadow consistent with gallstones. Largest gallstone measures 6 mm. Gallbladder wall appears somewhat thickened with equivocal pericholecystic fluid. No sonographic Murphy sign noted by sonographer. Common bile duct: Diameter: 5 mm. No  intrahepatic or extrahepatic biliary duct dilatation. Liver: No focal lesion identified. Liver echogenicity is within normal limits. IMPRESSION: Gallstones with gallbladder wall thickening and equivocal pericholecystic fluid. These are findings indicative of a degree of cholecystitis, quite possibly chronic based on hepatobiliary imaging study appearance 1 day prior. Electronically Signed   By: Bretta Bang III M.D.   On: 08/08/2016 11:51   Scheduled Meds: . diltiazem  120 mg Oral Daily  . febuxostat  40 mg Oral Daily  . finasteride  5 mg Oral Daily  . latanoprost  1 drop Both Eyes QHS  . levothyroxine  50 mcg Oral QAC breakfast  . metoprolol  50 mg Oral BID  . pantoprazole  40 mg Oral Daily  . piperacillin-tazobactam (ZOSYN)  IV  2.25 g Intravenous Q6H  . pravastatin  40 mg Oral Daily  . predniSONE  5 mg Oral Daily  . tamsulosin  0.8 mg Oral QHS   Continuous Infusions: . sodium chloride 75 mL/hr at 08/08/16 1352    LOS: 2 days   Merlene Laughter, DO [facility] Pager [phone]  If 7PM-7AM,  please contact night-coverage [email or web address] Password [facility] 08/08/2016, 2:49 PM

## 2016-08-08 NOTE — Care Management Note (Addendum)
Case Management Note  Patient Details  Name: Olin PiaRalph Z Unterreiner MRN: [id number] Date of Birth: April 16, 1921  Subjective/Objective:   Pt admitted with abd pain and hematuria                 Action/Plan:  [facility] Pt live alone with support from daughter.    Expected Discharge Date:    08/10/16              Expected Discharge Plan:  Home w Home Health Services  In-House Referral:    Discharge planning Services  CM Consult  Post Acute Care Choice:    Choice offered to:  Adult Children  DME Arranged:    DME Agency:     HH [facility]   [facility] Agency:     Status of Service:  In process, will continue to follow  If discussed at Long Length of Stay Meetings, dates discussed:    Additional CommentsGeni Bers:  Anneth Brunell, RN 08/08/2016, 3:15 PM

## 2016-08-08 NOTE — Progress Notes (Signed)
Hypoglycemic Event  CBG: 66  Treatment: 15 GM carbohydrate snack  Symptoms: None  Follow-up CBG: Time:0036 CBG Result:113  Possible Reasons for Event: Inadequate meal intake  Comments/MD notified:    Rosine DoorSamantha W Shahla Betsill

## 2016-08-08 NOTE — Progress Notes (Signed)
Assessment:  1. Acute urinary retention - It appears this was secondary to the fact that he was not taking his Flomax and Proscar prior to admission due to his nausea and vomiting. He does have a very large prostate but indicated that he really doesn't have significant voiding symptoms typically. He needs to be back on his medication and once his urine clears can undergo a voiding trial.  2. Gross hematuria - His urine remains bloody but with no clots. This may be due to bleeding from the prostatic urethra with catheter placement in a patient who is on Coumadin and who was supratherapeutic. His PT remains elevated. He was not having hematuria prior to catheter placement.  Plan:  1. Continue Foley catheter drainage with when necessary hand irrigation. 2. Await further fall in his PT with clearance of his urine prior to voiding trial. 3. He needs to be back on his finasteride and I would recommend that his tamsulosin dosage be 0.8 mg.    Subjective: Patient reports no pain. He is tolerating his Foley catheter.  Objective: Vital signs in last 24 hours: Temp:  [98 F (36.7 C)-99.2 F (37.3 C)] 99.2 F (37.3 C) (03/16 0625) Pulse Rate:  [86-100] 94 (03/16 0625) Resp:  [16-18] 16 (03/16 0625) BP: (107-141)/(64-90) 107/64 (03/16 0625) SpO2:  [95 %-97 %] 95 % (03/16 0625) Weight:  [158 lb 8.2 oz (71.9 kg)] 158 lb 8.2 oz (71.9 kg) (03/16 0625)A  Intake/Output from previous day: 03/15 0701 - 03/16 0700 In: 1050 [I.V.:900; IV Piggyback:150] Out: 600 [Urine:600] Intake/Output this shift: No intake/output data recorded.  Past Medical History:  Diagnosis Date  . Chronic anticoagulation 2011   on coumadin  . Coronary artery disease 2012   PCI-LAD & LCX, Promus Des  . Echocardiogram abnormal 11/07/2010   EF 50-55%, LA mild to mod dilated, mod MR, RV SYSTOLIC PRESSure 40-50 mild-mod pul. HTN   . H/O cardiovascular stress test 11/07/2010   new scar in LCX distribution  . Hyperlipidemia LDL  goal < 70   . Hypertension   . Myocardial infarction 05/23/2010   non-ST elevation MI  . Permanent atrial fibrillation (HCC)   . Pulmonary embolus (HCC) 05/23/2010  . Pulmonary hypertension    mild to moderate    Physical Exam:  Lungs - Normal respiratory effort, chest expands symmetrically.  Abdomen - Soft, non-tender & non-distended. GU - Foley catheter draining, urine red Kool-Aid color with no clots.  Lab Results:  Recent Labs  08/06/16 1824 08/07/16 0536 08/08/16 0513  WBC 8.8 7.1 5.7  HGB 10.3* 9.3* 9.9*  HCT 32.3* 28.9* 30.5*   BMET  Recent Labs  08/07/16 0536 08/08/16 0513  NA 141 141  K 3.5 3.6  CL 106 108  CO2 26 26  GLUCOSE 88 80  BUN 55* 43*  CREATININE 2.69* 2.25*  CALCIUM 8.6* 8.5*   No results for input(s): LABURIN in the last 72 hours. Results for orders placed or performed during the hospital encounter of 05/23/10  MRSA PCR Screening     Status: None   Collection Time: 05/23/10 12:40 PM  Result Value Ref Range Status   MRSA by PCR  NEGATIVE Final    NEGATIVE        The GeneXpert MRSA Assay (FDA approved for NASAL specimens only), is one component of a comprehensive MRSA colonization surveillance program. It is not intended to diagnose MRSA infection nor to guide or monitor treatment for MRSA infections.    Studies/Results: Nm Hepato W/eject  Fract  Result Date: 08/07/2016 CLINICAL DATA:  83 year old male with circumferential gallbladder wall thickening on recent CT Abdomen and Pelvis. Elevated bilirubin. Abdominal pain. EXAM: NUCLEAR MEDICINE HEPATOBILIARY IMAGING WITH GALLBLADDER EF TECHNIQUE: Sequential images of the abdomen were obtained out to 60 minutes following intravenous administration of radiopharmaceutical. After slow intravenous infusion of 1.4 micrograms Cholecystokinin, gallbladder ejection fraction was determined. RADIOPHARMACEUTICALS:  7.8 mCi Tc-64m Choletec IV COMPARISON:  CT Abdomen and Pelvis 08/06/2016 FINDINGS: Prompt  radiotracer uptake by the liver. Mildly slow clearance from the blood pool. CBD and small bowel activity visible by 40 minutes. Probable faint gallbladder activity on the 30 minutes image, but was not definitive. After an additional 30 minutes, and beginning at 70 minutes, gallbladder activity was confidently identified along the inferior margin of the right liver. Small bowel activity increased over the course of the study. Following CCK administration the gallbladder activity did wax and wane indicating some gallbladder emptying, but despite multiple attempts a valid ejection fraction could not be determined. The patient was not symptomatic during or after CCK infusion. IMPRESSION: 1. Patent CBD without evidence of biliary ductal obstruction. 2. Suspect Chronic Cholecystitis as delayed gallbladder filling was observed. Waxing and waning gallbladder activity such that a valid ejection fraction could not be determined. Electronically Signed   By: Odessa Fleming M.D.   On: 08/07/2016 15:09      Luciann Gossett C 08/08/2016, 7:29 AM

## 2016-08-09 DIAGNOSIS — K811 Chronic cholecystitis: Secondary | ICD-10-CM

## 2016-08-09 LAB — CBC WITH DIFFERENTIAL/PLATELET
BASOS ABS: 0 10*3/uL (ref 0.0–0.1)
BASOS PCT: 0 %
Eosinophils Absolute: 0.1 10*3/uL (ref 0.0–0.7)
Eosinophils Relative: 2 %
HEMATOCRIT: 29.6 % — AB (ref 39.0–52.0)
HEMOGLOBIN: 9.3 g/dL — AB (ref 13.0–17.0)
LYMPHS PCT: 11 %
Lymphs Abs: 0.6 10*3/uL — ABNORMAL LOW (ref 0.7–4.0)
MCH: 27 pg (ref 26.0–34.0)
MCHC: 31.4 g/dL (ref 30.0–36.0)
MCV: 86 fL (ref 78.0–100.0)
Monocytes Absolute: 0.5 10*3/uL (ref 0.1–1.0)
Monocytes Relative: 10 %
NEUTROS ABS: 4.1 10*3/uL (ref 1.7–7.7)
Neutrophils Relative %: 77 %
Platelets: 152 10*3/uL (ref 150–400)
RBC: 3.44 MIL/uL — AB (ref 4.22–5.81)
RDW: 15.2 % (ref 11.5–15.5)
WBC: 5.3 10*3/uL (ref 4.0–10.5)

## 2016-08-09 LAB — COMPREHENSIVE METABOLIC PANEL
ALBUMIN: 2.6 g/dL — AB (ref 3.5–5.0)
ALT: 26 U/L (ref 17–63)
AST: 24 U/L (ref 15–41)
Alkaline Phosphatase: 97 U/L (ref 38–126)
Anion gap: 9 (ref 5–15)
BILIRUBIN TOTAL: 2.1 mg/dL — AB (ref 0.3–1.2)
BUN: 37 mg/dL — ABNORMAL HIGH (ref 6–20)
CO2: 23 mmol/L (ref 22–32)
CREATININE: 2.06 mg/dL — AB (ref 0.61–1.24)
Calcium: 8.1 mg/dL — ABNORMAL LOW (ref 8.9–10.3)
Chloride: 107 mmol/L (ref 101–111)
GFR calc Af Amer: 30 mL/min — ABNORMAL LOW (ref >60)
GFR, EST NON AFRICAN AMERICAN: 26 mL/min — AB (ref >60)
GLUCOSE: 111 mg/dL — AB (ref 65–99)
POTASSIUM: 3.5 mmol/L (ref 3.5–5.1)
Sodium: 139 mmol/L (ref 135–145)
TOTAL PROTEIN: 5.8 g/dL — AB (ref 6.5–8.1)

## 2016-08-09 LAB — GLUCOSE, CAPILLARY
GLUCOSE-CAPILLARY: 180 mg/dL — AB (ref 65–99)
GLUCOSE-CAPILLARY: 97 mg/dL (ref 65–99)
Glucose-Capillary: 152 mg/dL — ABNORMAL HIGH (ref 65–99)

## 2016-08-09 LAB — MAGNESIUM: Magnesium: 1.8 mg/dL (ref 1.7–2.4)

## 2016-08-09 LAB — PROTIME-INR
INR: 3.99
Prothrombin Time: 39.9 seconds — ABNORMAL HIGH (ref 11.4–15.2)

## 2016-08-09 LAB — PHOSPHORUS: Phosphorus: 2.5 mg/dL (ref 2.5–4.6)

## 2016-08-09 MED ORDER — NAPHAZOLINE-PHENIRAMINE 0.025-0.3 % OP SOLN
1.0000 [drp] | Freq: Four times a day (QID) | OPHTHALMIC | Status: DC | PRN
  Administered 2016-08-10: 1 [drp] via OPHTHALMIC
  Filled 2016-08-09: qty 5

## 2016-08-09 NOTE — Progress Notes (Signed)
Subjective: Patient reports improvement in hematuria  Objective: Vital signs in last 24 hours: Temp:  [97.7 F (36.5 C)-98.9 F (37.2 C)] 97.9 F (36.6 C) (03/17 0637) Pulse Rate:  [61-89] 61 (03/17 0637) Resp:  [18] 18 (03/17 0637) BP: (110-133)/(65-81) 125/65 (03/17 0637) SpO2:  [96 %] 96 % (03/17 0637) Weight:  [71.1 kg (156 lb 12 oz)] 71.1 kg (156 lb 12 oz) (03/17 16100637)  Intake/Output from previous day: 03/16 0701 - 03/17 0700 In: 2123.8 [P.O.:480; I.V.:1443.8; IV Piggyback:200] Out: 975 [Urine:975] Intake/Output this shift: Total I/O In: 240 [P.O.:240] Out: -   Physical Exam:  General:alert, cooperative and appears stated age GI: soft, non tender, normal bowel sounds, no palpable masses, no organomegaly, no inguinal hernia Male genitalia: not done Extremities: extremities normal, atraumatic, no cyanosis or edema  Lab Results:  Recent Labs  08/07/16 0536 08/08/16 0513 08/09/16 0534  HGB 9.3* 9.9* 9.3*  HCT 28.9* 30.5* 29.6*   BMET  Recent Labs  08/08/16 0513 08/09/16 0534  NA 141 139  K 3.6 3.5  CL 108 107  CO2 26 23  GLUCOSE 80 111*  BUN 43* 37*  CREATININE 2.25* 2.06*  CALCIUM 8.5* 8.1*    Recent Labs  08/07/16 0704 08/08/16 0513 08/09/16 0534  INR 4.01* 3.62 3.99   No results for input(s): LABURIN in the last 72 hours. Results for orders placed or performed during the hospital encounter of 05/23/10  MRSA PCR Screening     Status: None   Collection Time: 05/23/10 12:40 PM  Result Value Ref Range Status   MRSA by PCR  NEGATIVE Final    NEGATIVE        The GeneXpert MRSA Assay (FDA approved for NASAL specimens only), is one component of a comprehensive MRSA colonization surveillance program. It is not intended to diagnose MRSA infection nor to guide or monitor treatment for MRSA infections.    Studies/Results: Nm Hepato W/eject Fract  Result Date: 08/07/2016 CLINICAL DATA:  83 year old male with circumferential gallbladder  wall thickening on recent CT Abdomen and Pelvis. Elevated bilirubin. Abdominal pain. EXAM: NUCLEAR MEDICINE HEPATOBILIARY IMAGING WITH GALLBLADDER EF TECHNIQUE: Sequential images of the abdomen were obtained out to 60 minutes following intravenous administration of radiopharmaceutical. After slow intravenous infusion of 1.4 micrograms Cholecystokinin, gallbladder ejection fraction was determined. RADIOPHARMACEUTICALS:  7.8 mCi Tc-3563m Choletec IV COMPARISON:  CT Abdomen and Pelvis 08/06/2016 FINDINGS: Prompt radiotracer uptake by the liver. Mildly slow clearance from the blood pool. CBD and small bowel activity visible by 40 minutes. Probable faint gallbladder activity on the 30 minutes image, but was not definitive. After an additional 30 minutes, and beginning at 70 minutes, gallbladder activity was confidently identified along the inferior margin of the right liver. Small bowel activity increased over the course of the study. Following CCK administration the gallbladder activity did wax and wane indicating some gallbladder emptying, but despite multiple attempts a valid ejection fraction could not be determined. The patient was not symptomatic during or after CCK infusion. IMPRESSION: 1. Patent CBD without evidence of biliary ductal obstruction. 2. Suspect Chronic Cholecystitis as delayed gallbladder filling was observed. Waxing and waning gallbladder activity such that a valid ejection fraction could not be determined. Electronically Signed   By: Odessa FlemingH  Hall M.D.   On: 08/07/2016 15:09   [place] Abdomen Limited Ruq  Result Date: 08/08/2016 CLINICAL DATA:  Right upper quadrant pain EXAM: US ABDOMEN LIMITED - RIGHT UPPER QUADRANT COMPARISON:  Nuclear medicine hepatobiliary imaging study August 07, 2016 and CT  abdomen and pelvis August 06, 2016 FINDINGS: Gallbladder: Within the gallbladder, there are echogenic foci which move and shadow consistent with gallstones. Largest gallstone measures 6 mm. Gallbladder wall appears  somewhat thickened with equivocal pericholecystic fluid. No sonographic Murphy sign noted by sonographer. Common bile duct: Diameter: 5 mm. No intrahepatic or extrahepatic biliary duct dilatation. Liver: No focal lesion identified. Liver echogenicity is within normal limits. IMPRESSION: Gallstones with gallbladder wall thickening and equivocal pericholecystic fluid. These are findings indicative of a degree of cholecystitis, quite possibly chronic based on hepatobiliary imaging study appearance 1 day prior. Electronically Signed   By: Bretta Bang III M.D.   On: 08/08/2016 11:51    Assessment/Plan: 83yo with gross hematuria and urinary retention\  1. Gross hematuria: resolved  2. Urinary retention: Please continue indwelling foley. The patient should followup in 1 week after discharge for a voiding trial. Continue flomax 0.8mg  and finasteride   LOS: 3 days   Wilkie Aye 08/09/2016, 12:14 PM

## 2016-08-09 NOTE — Progress Notes (Signed)
Subjective: He denies any abdominal pain. Tolerating po  Objective: Vital signs in last 24 hours: Temp:  [97.7 F (36.5 C)-98.9 F (37.2 C)] 97.9 F (36.6 C) (03/17 0637) Pulse Rate:  [61-89] 61 (03/17 0637) Resp:  [18] 18 (03/17 0637) BP: (110-133)/(65-81) 125/65 (03/17 0637) SpO2:  [96 %] 96 % (03/17 0637) Weight:  [71.1 kg (156 lb 12 oz)] 71.1 kg (156 lb 12 oz) (03/17 0637) Last BM Date: 08/06/16  Intake/Output from previous day: 03/16 0701 - 03/17 0700 In: 2123.8 [P.O.:480; I.V.:1443.8; IV Piggyback:200] Out: 975 [Urine:975] Intake/Output this shift: Total I/O In: 240 [P.O.:240] Out: -   Exam: Awake and alert Appears comfortable Abdomen mildly distended.  Some guarding to deep palpation of the RUQ  Lab Results:   Recent Labs  08/08/16 0513 08/09/16 0534  WBC 5.7 5.3  HGB 9.9* 9.3*  HCT 30.5* 29.6*  PLT 157 152   BMET  Recent Labs  08/08/16 0513 08/09/16 0534  NA 141 139  K 3.6 3.5  CL 108 107  CO2 26 23  GLUCOSE 80 111*  BUN 43* 37*  CREATININE 2.25* 2.06*  CALCIUM 8.5* 8.1*   PT/INR  Recent Labs  08/08/16 0513 08/09/16 0534  LABPROT 36.9* 39.9*  INR 3.62 3.99   ABG No results for input(s): PHART, HCO3 in the last 72 hours.  Invalid input(s): PCO2, PO2  Studies/Results: Nm Hepato W/eject Fract  Result Date: 08/07/2016 CLINICAL DATA:  83 year old male with circumferential gallbladder wall thickening on recent CT Abdomen and Pelvis. Elevated bilirubin. Abdominal pain. EXAM: NUCLEAR MEDICINE HEPATOBILIARY IMAGING WITH GALLBLADDER EF TECHNIQUE: Sequential images of the abdomen were obtained out to 60 minutes following intravenous administration of radiopharmaceutical. After slow intravenous infusion of 1.4 micrograms Cholecystokinin, gallbladder ejection fraction was determined. RADIOPHARMACEUTICALS:  7.8 mCi Tc-6162m Choletec IV COMPARISON:  CT Abdomen and Pelvis 08/06/2016 FINDINGS: Prompt radiotracer uptake by the liver. Mildly slow  clearance from the blood pool. CBD and small bowel activity visible by 40 minutes. Probable faint gallbladder activity on the 30 minutes image, but was not definitive. After an additional 30 minutes, and beginning at 70 minutes, gallbladder activity was confidently identified along the inferior margin of the right liver. Small bowel activity increased over the course of the study. Following CCK administration the gallbladder activity did wax and wane indicating some gallbladder emptying, but despite multiple attempts a valid ejection fraction could not be determined. The patient was not symptomatic during or after CCK infusion. IMPRESSION: 1. Patent CBD without evidence of biliary ductal obstruction. 2. Suspect Chronic Cholecystitis as delayed gallbladder filling was observed. Waxing and waning gallbladder activity such that a valid ejection fraction could not be determined. Electronically Signed   By: Odessa FlemingH  Hall M.D.   On: 08/07/2016 15:09   [place] Abdomen Limited Ruq  Result Date: 08/08/2016 CLINICAL DATA:  Right upper quadrant pain EXAM: US ABDOMEN LIMITED - RIGHT UPPER QUADRANT COMPARISON:  Nuclear medicine hepatobiliary imaging study August 07, 2016 and CT abdomen and pelvis August 06, 2016 FINDINGS: Gallbladder: Within the gallbladder, there are echogenic foci which move and shadow consistent with gallstones. Largest gallstone measures 6 mm. Gallbladder wall appears somewhat thickened with equivocal pericholecystic fluid. No sonographic Murphy sign noted by sonographer. Common bile duct: Diameter: 5 mm. No intrahepatic or extrahepatic biliary duct dilatation. Liver: No focal lesion identified. Liver echogenicity is within normal limits. IMPRESSION: Gallstones with gallbladder wall thickening and equivocal pericholecystic fluid. These are findings indicative of a degree of cholecystitis, quite possibly chronic based on  hepatobiliary imaging study appearance 1 day prior. Electronically Signed   By: Bretta Bang  III M.D.   On: 08/08/2016 11:51    Anti-infectives: Anti-infectives    Start     Dose/Rate Route Frequency Ordered Stop   08/07/16 0600  piperacillin-tazobactam (ZOSYN) IVPB 2.25 g     2.25 g 100 mL/hr over 30 Minutes Intravenous Every 6 hours 08/07/16 0332     08/06/16 2245  piperacillin-tazobactam (ZOSYN) IVPB 3.375 g     3.375 g 100 mL/hr over 30 Minutes Intravenous  Once 08/06/16 2234 08/07/16 0021      Assessment/Plan:  Mild chronic cholecystitis   WBC normal and bili decreased.  INR elevated on chronic anticoag meds  Patient refuses any intervention currently so will continue conservative management with IV antibiotics.   LOS: 3 days    Kalan Yeley A 08/09/2016

## 2016-08-09 NOTE — Progress Notes (Signed)
PROGRESS NOTE    Troy [name]  UEA:[id number] DOB: 02-25-1921 DOA: 08/06/2016 PCP: Pearson Grippe, MD   Brief Narrative:  Troy [name] is a 83 y.o. male with history of CAD status post stenting, atrial fibrillation, history of PE, history of hypertension hyperlipidemia and other comorbids presents to the ER because of increasing lower abdominal pain with difficulty urinating. Denies fever chills diarrhea nausea vomiting currently but daughter stated he was vomiting PTA. Had CT of Abdomen done which showed ? Colitis so General Surgery was consulted and Foley catheter was placed for his acute Urinary Retention and patient ended up with Hematuria so Urology was consulted. INR was supratherapeutic on Admission. Patient feeling well and has no complaints currently.   Assessment & Plan:   Principal Problem:   Abdominal pain Active Problems:   Long term current use of anticoagulant therapy   Permanent atrial fibrillation (HCC)   Hypertension   AKI (acute kidney injury) (HCC)   Urinary retention  Lower Abdominal pain with Urinary obstruction and Retention, improved -S/p Insertion of Foley and now has Hematuria -Urology Following and Appreciate Recc's -C/w Foley Catheter Drainage and Hand Irrigation as necessary -Per Urology will need to continue indwelling foley catheter and patient should follow up with [facility] in 1 week after D/C for voiding Trial  -C/w Finasteride 5 mg po Daily and with Tamsulosin 0.8 mg Daily  Gross Hematuria, improved  -As above -INR is now 3.99  Mild Chronic Cholecystitis -Had N/V prior to Admission -Abnormal CAT scan concerning for cholecystitis as showes wall thickening and diffuse inflammatory changes about the gallbladder suggesting acute cholecystitis- on exam patient does not have any abdominal tenderness and patient denies any nausea vomiting. -HIDA Scan ordered and showes Patent CBD without evidence of biliary ductal obstructuion and suspect chronic  cholecystitis as delayed gallbladder filling was observed and waxing and waning gallbladder activity such that a valid EF could not be determined -Ultrasound of GallBladder Showed Gallstones with gallbladder wall thickening and equivocal pericholecystic fluid. These are findings indicative of a degree of cholecystitis, quite possibly chronic based on hepatobiliary imaging study appearance 1 day prior. -General Surgery Consulted and Following and appreciate Recc's -Per Dr. Eliberto Ivory note Surgery will not intervene surgically as patient refused -C/w IV Zosyn for Empiric Coverage and Conservative Management with IV Abx per General Surgery recc's -Patient Afebrile and has no WBC (5.3)  Acute on Chronic Renal Failure with urinary retention  -Further review reveals baseline Cr 1.3-15 -Probably secondary to Urinary Retention along with some pre-renal component.  -Patient is s/p Foley catheter placement.  -Gently hydrate with NS at 75 mL/hr.  -Hold lisinopril due to acute renal failure. -BUN/Cr improving and went from 55/2.69 -> 43/2.25 -> 37/2.06 -Repeat CMP in AM  Permanent Atrial Fbrillation  -Continue Metoprolol 50 mg po BID and Cardizem 120 mg po Daily.  -Holding off Coumadin because INR was supratherapeutic (went from 4.01 -> 3.62 -> 3.99) -C/w Telemetry  Hypertension  -C/w Metoprolol 50 mg po BID and Cardizem 120 mg po Daily.  -Holding lisinopril due to acute renal failure.  -When necessary IV hydralazine for systolic blood pressure more than 160. Patient is also on Cardizem and metoprolol.  Normocytic Anemia  -Hb/Hct went from 9.9/30.5 -> 9.3/29.6 -Hematuria in the Foley catheter improved and Resolved.  -Closely follow CBC.  CAD status post stenting  -denies any chest pain. -C/w NTG 0.4 mg SL q64minPRN -C/w Metoprolol 50 mg po BID  Hypothyroidism -C/w Levothyroxine 50 mcg Po Daily  Hx of PE -Coumadin Held because of Supratherapeutic INR at 3.99  DVT prophylaxis: SCD's;  INR was supratherapetic on Coumadin Code Status: FULL CODE Family Communication: No Family at bedside Disposition: Home Health PT at D/C  Consultants:   General Surgery  Urology   Procedures: HIDA Scan and Ultrasound of Abdomen  Antimicrobials:  Anti-infectives    Start     Dose/Rate Route Frequency Ordered Stop   08/07/16 0600  piperacillin-tazobactam (ZOSYN) IVPB 2.25 g     2.25 g 100 mL/hr over 30 Minutes Intravenous Every 6 hours 08/07/16 0332     08/06/16 2245  piperacillin-tazobactam (ZOSYN) IVPB 3.375 g     3.375 g 100 mL/hr over 30 Minutes Intravenous  Once 08/06/16 2234 08/07/16 0021     Subjective: Seen and examined and had no complaints this AM and slept well. Thinks Urine is better today. No Abdominal Pain and no Nausea or Vomiting.    Objective: Vitals:   08/08/16 1330 08/08/16 1507 08/08/16 2111 08/09/16 0637  BP: 110/75 128/70 133/81 125/65  Pulse: 89 71 70 61  Resp: 18   18  Temp: 98.9 F (37.2 C) 97.8 F (36.6 C) 97.7 F (36.5 C) 97.9 F (36.6 C)  TempSrc: Oral Oral Oral Oral  SpO2: 96% 96% 96% 96%  Weight:    71.1 kg (156 lb 12 oz)  Height:        Intake/Output Summary (Last 24 hours) at 08/09/16 1454 Last data filed at 08/09/16 0900  Gross per 24 hour  Intake          2123.75 ml  Output              975 ml  Net          1148.75 ml   Filed Weights   08/07/16 0538 08/08/16 0625 08/09/16 0637  Weight: 68.9 kg (151 lb 14.4 oz) 71.9 kg (158 lb 8.2 oz) 71.1 kg (156 lb 12 oz)   Examination: Physical Exam:  Constitutional: Elderly gentleman appears younger than stated age in NAD and appears calm and comfortable Eyes: Lids and conjunctivae normal, sclerae anicteric  ENMT: External Ears, Nose appear normal. Grossly normal hearing.  Neck: Appears normal, supple, no cervical masses, normal ROM, no appreciable thyromegaly Respiratory: Diminished to auscultation bilaterally, no wheezing, rales, rhonchi or crackles. Normal respiratory effort and  patient is not tachypenic. No accessory muscle use.  Cardiovascular: Irregularly Irregular, no murmurs / rubs / gallops. S1 and S2 auscultated. No extremity edema noted. Abdomen: Soft, non-tender, non-distended. No masses palpated. No appreciable hepatosplenomegaly. Bowel sounds positive x4.  GU: Deferred. Foley draining amber urine. Musculoskeletal: No clubbing / cyanosis of digits/nails. No joint deformity upper and lower extremities.   Skin: No rashes, lesions, ulcers. Has seborrheic keratosis on face. No induration; Warm and dry.  Neurologic: CN 2-12 grossly intact with no focal deficits. Sensation intact in all 4 Extremities. Romberg sign cerebellar reflexes not assessed.  Psychiatric: Normal judgment and insight. Alert and oriented x 3. Normal mood and appropriate affect.   Data Reviewed: I have personally reviewed following labs and imaging studies  CBC:  Recent Labs Lab 08/06/16 1824 08/07/16 0536 08/08/16 0513 08/09/16 0534  WBC 8.8 7.1 5.7 5.3  NEUTROABS 7.3 5.8 4.3 4.1  HGB 10.3* 9.3* 9.9* 9.3*  HCT 32.3* 28.9* 30.5* 29.6*  MCV 87.5 83.8 86.4 86.0  PLT 145* 143* 157 152   Basic Metabolic Panel:  Recent Labs Lab 08/06/16 1824 08/07/16 0536 08/08/16 0513 08/09/16 0534  NA  134* 141 141 139  K 3.6 3.5 3.6 3.5  CL 98* 106 108 107  CO2 26 26 26 23   GLUCOSE 181* 88 80 111*  BUN 57* 55* 43* 37*  CREATININE 3.13* 2.69* 2.25* 2.06*  CALCIUM 8.6* 8.6* 8.5* 8.1*  MG  --   --  1.9 1.8  PHOS  --   --  2.4* 2.5   GFR: Estimated Creatinine Clearance: 21.7 mL/min (A) (by C-G formula based on SCr of 2.06 mg/dL (H)). Liver Function Tests:  Recent Labs Lab 08/06/16 1824 08/07/16 0536 08/08/16 0513 08/09/16 0534  AST 66* 42* 31 24  ALT 52 41 33 26  ALKPHOS 123 104 110 97  BILITOT 3.9* 2.9* 2.7* 2.1*  PROT 7.2 6.1* 6.1* 5.8*  ALBUMIN 3.4* 2.9* 2.6* 2.6*   No results for input(s): LIPASE, AMYLASE in the last 168 hours. No results for input(s): AMMONIA in the last  168 hours. Coagulation Profile:  Recent Labs Lab 08/06/16 1824 08/07/16 0536 08/07/16 0704 08/08/16 0513 08/09/16 0534  INR 4.34* 4.13* 4.01* 3.62 3.99   Cardiac Enzymes: No results for input(s): CKTOTAL, CKMB, CKMBINDEX, TROPONINI in the last 168 hours. BNP (last 3 results) No results for input(s): PROBNP in the last 8760 hours. HbA1C: No results for input(s): HGBA1C in the last 72 hours. CBG:  Recent Labs Lab 08/08/16 0036 08/08/16 0802 08/08/16 1606 08/09/16 0012 08/09/16 0805  GLUCAP 113* 78 121* 152* 97   Lipid Profile: No results for input(s): CHOL, HDL, LDLCALC, TRIG, CHOLHDL, LDLDIRECT in the last 72 hours. Thyroid Function Tests: No results for input(s): TSH, T4TOTAL, FREET4, T3FREE, THYROIDAB in the last 72 hours. Anemia Panel: No results for input(s): VITAMINB12, FOLATE, FERRITIN, TIBC, IRON, RETICCTPCT in the last 72 hours. Sepsis Labs: No results for input(s): PROCALCITON, LATICACIDVEN in the last 168 hours.  No results found for this or any previous visit (from the past 240 hour(s)).   Radiology Studies: US Abdomen Limited Ruq  Result Date: 08/08/2016 CLINICAL DATA:  Right upper quadrant pain EXAM: US ABDOMEN LIMITED - RIGHT UPPER QUADRANT COMPARISON:  Nuclear medicine hepatobiliary imaging study August 07, 2016 and CT abdomen and pelvis August 06, 2016 FINDINGS: Gallbladder: Within the gallbladder, there are echogenic foci which move and shadow consistent with gallstones. Largest gallstone measures 6 mm. Gallbladder wall appears somewhat thickened with equivocal pericholecystic fluid. No sonographic Murphy sign noted by sonographer. Common bile duct: Diameter: 5 mm. No intrahepatic or extrahepatic biliary duct dilatation. Liver: No focal lesion identified. Liver echogenicity is within normal limits. IMPRESSION: Gallstones with gallbladder wall thickening and equivocal pericholecystic fluid. These are findings indicative of a degree of cholecystitis, quite  possibly chronic based on hepatobiliary imaging study appearance 1 day prior. Electronically Signed   By: Bretta Bang III M.D.   On: 08/08/2016 11:51   Scheduled Meds: . diltiazem  120 mg Oral Daily  . febuxostat  40 mg Oral Daily  . finasteride  5 mg Oral Daily  . latanoprost  1 drop Both Eyes QHS  . levothyroxine  50 mcg Oral QAC breakfast  . metoprolol  50 mg Oral BID  . pantoprazole  40 mg Oral Daily  . piperacillin-tazobactam (ZOSYN)  IV  2.25 g Intravenous Q6H  . pravastatin  40 mg Oral Daily  . predniSONE  5 mg Oral Daily  . tamsulosin  0.8 mg Oral QHS   Continuous Infusions: . sodium chloride 75 mL/hr at 08/08/16 1352    LOS: 3 days   [facility],  DO [facility] Pager [phone]  If 7PM-7AM, please contact night-coverage [email or web address] Password [email or web address] 08/09/2016, 2:54 PM

## 2016-08-09 NOTE — Evaluation (Signed)
Physical Therapy Evaluation Patient Details Name: Troy [name] MRN: [id number] DOB: Feb 21, 1921 Today's Date: 08/09/2016   History of Present Illness  83 y.o. male with history of CAD status post stenting, atrial fibrillation, history of PE, history of hypertension hyperlipidemia and other comorbids presents to the ER because of increasing lower abdominal pain with difficulty urinating. Dx of AKI, urinary obstruction and retention, cholecystitis, a fib.   Clinical Impression  Pt admitted with above diagnosis. Pt currently with functional limitations due to the deficits listed below (see PT Problem List). Pt ambulated 100' with RW and min/guard assist. Good progress expected.  Pt will benefit from skilled PT to increase their independence and safety with mobility to allow discharge to the venue listed below.       Follow Up Recommendations Home health PT    Equipment Recommendations  None recommended by PT    Recommendations for Other Services       Precautions / Restrictions Precautions Precautions: Fall Precaution Comments: h/o 1 fall in past 1 year Restrictions Weight Bearing Restrictions: No      Mobility  Bed Mobility Overal bed mobility: Modified Independent             General bed mobility comments: HOB up  Transfers Overall transfer level: Needs assistance Equipment used: Rolling walker (2 wheeled) Transfers: Sit to/from Stand Sit to Stand: Min assist         General transfer comment: VCs hand placement  Ambulation/Gait Ambulation/Gait assistance: Min guard Ambulation Distance (Feet): 100 Feet Assistive device: Rolling walker (2 wheeled) Gait Pattern/deviations: Step-through pattern;Trunk flexed;Decreased stride length   Gait velocity interpretation: Below normal speed for age/gender General Gait Details: VCs for positioning in RW and for posture  Stairs            Wheelchair Mobility    Modified Rankin (Stroke Patients Only)        Balance Overall balance assessment: Modified Independent                                           Pertinent Vitals/Pain Pain Assessment: No/denies pain    Home Living Family/patient expects to be discharged to:: Private residence Living Arrangements: Alone Available Help at Discharge: (P) Family;Available PRN/intermittently Type of Home: (P) House Home Access: Stairs to enter Entrance Stairs-Rails: Can reach both;Left;Right Entrance Stairs-Number of Steps: 2 Home Layout: (P) One level Home Equipment: Walker - 2 wheels;Cane - single point      Prior Function Level of Independence: Independent with assistive device(s)         Comments: walks with SPC, family can assist prn not 24*     Hand Dominance        Extremity/Trunk Assessment   Upper Extremity Assessment Upper Extremity Assessment: Overall WFL for tasks assessed    Lower Extremity Assessment Lower Extremity Assessment: Overall WFL for tasks assessed (B knee ext AROM -10*, knee ext 5/5 B)    Cervical / Trunk Assessment Cervical / Trunk Assessment: Kyphotic  Communication      Cognition Arousal/Alertness: Awake/alert Behavior During Therapy: WFL for tasks assessed/performed Overall Cognitive Status: Within Functional Limits for tasks assessed                      General Comments      Exercises     Assessment/Plan    PT Assessment Patient needs continued PT  services  PT Problem List Decreased range of motion;Decreased activity tolerance;Decreased mobility       PT Treatment Interventions DME instruction;Gait training;Functional mobility training;Therapeutic exercise;Therapeutic activities;Patient/family education    PT Goals (Current goals can be found in the Care Plan section)  Acute Rehab PT Goals Patient Stated Goal: DC home PT Goal Formulation: With patient Time For Goal Achievement: 08/23/16 Potential to Achieve Goals: Good    Frequency Min 3X/week    Barriers to discharge        Co-evaluation               End of Session Equipment Utilized During Treatment: Gait belt Activity Tolerance: Patient tolerated treatment well Patient left: in chair;with call bell/phone within reach;with chair alarm set Nurse Communication: Mobility status PT Visit Diagnosis: History of falling (Z91.81);Other abnormalities of gait and mobility (R26.89)         Time: [id number] PT Time Calculation (min) (ACUTE ONLY): 12 min   Charges:   PT Evaluation $PT Eval Low Complexity: 1 Procedure     PT G Codes:         Tamala SerUhlenberg, Sebastian Dzik Kistler 08/09/2016, 12:46 PM [phone]

## 2016-08-10 ENCOUNTER — Inpatient Hospital Stay (HOSPITAL_COMMUNITY): Payer: Medicare Other

## 2016-08-10 DIAGNOSIS — R0602 Shortness of breath: Secondary | ICD-10-CM

## 2016-08-10 DIAGNOSIS — E8779 Other fluid overload: Secondary | ICD-10-CM

## 2016-08-10 LAB — CBC WITH DIFFERENTIAL/PLATELET
Basophils Absolute: 0 10*3/uL (ref 0.0–0.1)
Basophils Relative: 0 %
EOS ABS: 0.1 10*3/uL (ref 0.0–0.7)
Eosinophils Relative: 1 %
HEMATOCRIT: 27.8 % — AB (ref 39.0–52.0)
HEMOGLOBIN: 8.9 g/dL — AB (ref 13.0–17.0)
LYMPHS ABS: 0.5 10*3/uL — AB (ref 0.7–4.0)
LYMPHS PCT: 9 %
MCH: 28.1 pg (ref 26.0–34.0)
MCHC: 32 g/dL (ref 30.0–36.0)
MCV: 87.7 fL (ref 78.0–100.0)
Monocytes Absolute: 0.6 10*3/uL (ref 0.1–1.0)
Monocytes Relative: 10 %
Neutro Abs: 4.6 10*3/uL (ref 1.7–7.7)
Neutrophils Relative %: 80 %
Platelets: 154 10*3/uL (ref 150–400)
RBC: 3.17 MIL/uL — AB (ref 4.22–5.81)
RDW: 15.5 % (ref 11.5–15.5)
WBC: 5.8 10*3/uL (ref 4.0–10.5)

## 2016-08-10 LAB — COMPREHENSIVE METABOLIC PANEL
ALBUMIN: 2.6 g/dL — AB (ref 3.5–5.0)
ALK PHOS: 96 U/L (ref 38–126)
ALT: 25 U/L (ref 17–63)
AST: 23 U/L (ref 15–41)
Anion gap: 7 (ref 5–15)
BILIRUBIN TOTAL: 1.7 mg/dL — AB (ref 0.3–1.2)
BUN: 38 mg/dL — ABNORMAL HIGH (ref 6–20)
CALCIUM: 8.4 mg/dL — AB (ref 8.9–10.3)
CO2: 27 mmol/L (ref 22–32)
CREATININE: 2.04 mg/dL — AB (ref 0.61–1.24)
Chloride: 108 mmol/L (ref 101–111)
GFR calc non Af Amer: 26 mL/min — ABNORMAL LOW (ref >60)
GFR, EST AFRICAN AMERICAN: 31 mL/min — AB (ref >60)
GLUCOSE: 123 mg/dL — AB (ref 65–99)
Potassium: 4.4 mmol/L (ref 3.5–5.1)
SODIUM: 142 mmol/L (ref 135–145)
Total Protein: 6 g/dL — ABNORMAL LOW (ref 6.5–8.1)

## 2016-08-10 LAB — GLUCOSE, CAPILLARY
GLUCOSE-CAPILLARY: 117 mg/dL — AB (ref 65–99)
Glucose-Capillary: 101 mg/dL — ABNORMAL HIGH (ref 65–99)

## 2016-08-10 LAB — PHOSPHORUS: Phosphorus: 2.3 mg/dL — ABNORMAL LOW (ref 2.5–4.6)

## 2016-08-10 LAB — PROTIME-INR
INR: 2.86
Prothrombin Time: 30.6 seconds — ABNORMAL HIGH (ref 11.4–15.2)

## 2016-08-10 LAB — MAGNESIUM: Magnesium: 2 mg/dL (ref 1.7–2.4)

## 2016-08-10 MED ORDER — FUROSEMIDE 10 MG/ML IJ SOLN
40.0000 mg | Freq: Once | INTRAMUSCULAR | Status: AC
  Administered 2016-08-10: 40 mg via INTRAVENOUS
  Filled 2016-08-10: qty 4

## 2016-08-10 MED ORDER — PIPERACILLIN-TAZOBACTAM 3.375 G IVPB
3.3750 g | Freq: Three times a day (TID) | INTRAVENOUS | Status: DC
  Administered 2016-08-11 – 2016-08-12 (×4): 3.375 g via INTRAVENOUS
  Filled 2016-08-10 (×6): qty 50

## 2016-08-10 NOTE — Progress Notes (Signed)
PROGRESS NOTE    Troy [name]  WJX:[id number] DOB: [birth year] DOA: 08/06/2016 PCP: Pearson Grippe, MD   Brief Narrative:  Troy [name] is a 83 y.o. male with history of CAD status post stenting, atrial fibrillation, history of PE, history of hypertension hyperlipidemia and other comorbids presents to the ER because of increasing lower abdominal pain with difficulty urinating. Denies fever chills diarrhea nausea vomiting currently but daughter stated he was vomiting PTA. Had CT of Abdomen done which showed ? Colitis so General Surgery was consulted and Foley catheter was placed for his acute Urinary Retention and patient ended up with Hematuria so Urology was consulted. INR was supratherapeutic on Admission. Patient feeling well and has no complaints currently except that he started coughing again.  Assessment & Plan:   Principal Problem:   Abdominal pain Active Problems:   Long term current use of anticoagulant therapy   Permanent atrial fibrillation (HCC)   Hypertension   AKI (acute kidney injury) (HCC)   Urinary retention  Lower Abdominal pain with Urinary obstruction and Retention, improved -S/p Insertion of Foley and now has Hematuria -Urology Following and Appreciate Recc's -C/w Foley Catheter Drainage and Hand Irrigation as necessary -Per Urology will need to continue indwelling foley catheter and patient should follow up with [facility] in 1 week after D/C for voiding Trial  -C/w Finasteride 5 mg po Daily and with Tamsulosin 0.8 mg Daily  Gross Hematuria, improved  -As above -INR is now 2.86  Mild Chronic Cholecystitis -Had N/V prior to Admission -Abnormal CAT scan concerning for cholecystitis as showes wall thickening and diffuse inflammatory changes about the gallbladder suggesting acute cholecystitis- on exam patient does not have any abdominal tenderness and patient denies any nausea vomiting. -HIDA Scan ordered and showes Patent CBD without evidence of biliary  ductal obstructuion and suspect chronic cholecystitis as delayed gallbladder filling was observed and waxing and waning gallbladder activity such that a valid EF could not be determined -Ultrasound of GallBladder Showed Gallstones with gallbladder wall thickening and equivocal pericholecystic fluid. These are findings indicative of a degree of cholecystitis, quite possibly chronic based on hepatobiliary imaging study appearance 1 day prior. -General Surgery Consulted and Following and appreciate Recc's -Per Dr. Eliberto Ivory note Surgery will not intervene surgically as patient refused -C/w IV Zosyn for Empiric Coverage and Conservative Management with IV Abx per General Surgery recc's -Patient Afebrile and has no WBC (5.8) -Per General Surgery may be discharged in next 1-2 days and follow up with Dr. Gerrit Friends as an outpatient and continue Abx at Discharge  Acute on Chronic Renal Failure with urinary retention  -Further review reveals baseline Cr 1.3-15 -Probably secondary to Urinary Retention along with some pre-renal component.  -Patient is s/p Foley catheter placement.  -Gently hydrate with NS at 75 mL/hr.  -Hold lisinopril due to acute renal failure. -BUN/Cr improving and went from 55/2.69 -> 43/2.25 -> 37/2.06 -> 38/2.04 -Repeat CMP in AM  Permanent Atrial Fbrillation  -Continue Metoprolol 50 mg po BID and Cardizem 120 mg po Daily.  -Holding off Coumadin because INR was supratherapeutic (went from 4.01 -> 3.62 -> 3.99 -> 2.86) -C/w Telemetry  Hypertension  -C/w Metoprolol 50 mg po BID and Cardizem 120 mg po Daily.  -Holding lisinopril due to acute renal failure.  -When necessary IV hydralazine for systolic blood pressure more than 160. Patient is also on Cardizem and metoprolol.  Normocytic Anemia  -Hb/Hct went from 9.9/30.5 -> 9.3/29.6 -> 8.9/27.8 -Hematuria in the Foley catheter improved and  Resolved.  -Closely follow CBC.  CAD status post stenting  -denies any chest pain. -C/w  NTG 0.4 mg SL q63minPRN -C/w Metoprolol 50 mg po BID  Hypothyroidism -C/w Levothyroxine 50 mcg Po Daily  Hx of PE -Coumadin Held because of Supratherapeutic INR at 2.86  Cough/Volume Overload -Discontinued IVF -obtained CXR and it showed Congestive heart failure. Enlarged cardiac silhouette. Venous hypertension with mild interstitial edema. Bilateral effusions with lower lobe atelectasis and/or pneumonia. -Will give 1x IV Lasix 40 mg and repeat CXR in AM  DVT prophylaxis: SCD's; INR was supratherapetic on Coumadin Code Status: FULL CODE Family Communication: No Family at bedside Disposition: Home Health PT at D/C  Consultants:   General Surgery  Urology   Procedures: HIDA Scan and Ultrasound of Abdomen  Antimicrobials:  Anti-infectives    Start     Dose/Rate Route Frequency Ordered Stop   08/10/16 1800  piperacillin-tazobactam (ZOSYN) IVPB 3.375 g     3.375 g 12.5 mL/hr over 240 Minutes Intravenous Every 8 hours 08/10/16 1051     08/07/16 0600  piperacillin-tazobactam (ZOSYN) IVPB 2.25 g  Status:  Discontinued     2.25 g 100 mL/hr over 30 Minutes Intravenous Every 6 hours 08/07/16 0332 08/10/16 1051   08/06/16 2245  piperacillin-tazobactam (ZOSYN) IVPB 3.375 g     3.375 g 100 mL/hr over 30 Minutes Intravenous  Once 08/06/16 2234 08/07/16 0021     Subjective: Seen and examined and had abdominal complaints but stated he started coughing and bringing up some mild sputum. No nausea or vomiting. No CP or SOB. No other concerns or complaints.     Objective: Vitals:   08/09/16 2123 08/10/16 0408 08/10/16 0543 08/10/16 1542  BP: 135/72 132/69  118/65  Pulse: (!) 59 71  (!) 53  Resp: 18 18  18   Temp: 98.2 F (36.8 C) 97.8 F (36.6 C)  97.8 F (36.6 C)  TempSrc: Oral Oral  Oral  SpO2: 97% 92%  99%  Weight:   70.9 kg (156 lb 3.2 oz)   Height:        Intake/Output Summary (Last 24 hours) at 08/10/16 1902 Last data filed at 08/10/16 1500  Gross per 24 hour  Intake              1825 ml  Output                0 ml  Net             1825 ml   Filed Weights   08/08/16 0625 08/09/16 0637 08/10/16 0543  Weight: 71.9 kg (158 lb 8.2 oz) 71.1 kg (156 lb 12 oz) 70.9 kg (156 lb 3.2 oz)   Examination: Physical Exam:  Constitutional: Elderly gentleman appears younger than stated age in NAD and appears calm and comfortable Eyes: Lids and conjunctivae normal, sclerae anicteric  ENMT: External Ears, Nose appear normal. Grossly normal hearing.  Neck: Appears normal, supple, no cervical masses, normal ROM, no appreciable thyromegaly Respiratory: Diminished to auscultation bilaterally, no wheezing, rales, rhonchi or crackles. Normal respiratory effort and patient is not tachypenic. No accessory muscle use.  Cardiovascular: Irregularly Irregular, no murmurs / rubs / gallops. S1 and S2 auscultated. No appreciable extremity edema noted. Abdomen: Soft, non-tender, non-distended. No masses palpated. No appreciable hepatosplenomegaly. Bowel sounds positive x4.  GU: Deferred. Foley draining amber urine. Musculoskeletal: No clubbing / cyanosis of digits/nails. No joint deformity upper and lower extremities.   Skin: No rashes, lesions, ulcers. Has seborrheic keratosis on  face. No induration; Warm and dry.  Neurologic: CN 2-12 grossly intact with no focal deficits. Sensation intact in all 4 Extremities. Romberg sign cerebellar reflexes not assessed.  Psychiatric: Normal judgment and insight. Alert and oriented x 3. Normal mood and appropriate affect.   Data Reviewed: I have personally reviewed following labs and imaging studies  CBC:  Recent Labs Lab 08/06/16 1824 08/07/16 0536 08/08/16 0513 08/09/16 0534 08/10/16 0509  WBC 8.8 7.1 5.7 5.3 5.8  NEUTROABS 7.3 5.8 4.3 4.1 4.6  HGB 10.3* 9.3* 9.9* 9.3* 8.9*  HCT 32.3* 28.9* 30.5* 29.6* 27.8*  MCV 87.5 83.8 86.4 86.0 87.7  PLT 145* 143* 157 152 154   Basic Metabolic Panel:  Recent Labs Lab 08/06/16 1824  08/07/16 0536 08/08/16 0513 08/09/16 0534 08/10/16 0509  NA 134* 141 141 139 142  K 3.6 3.5 3.6 3.5 4.4  CL 98* 106 108 107 108  CO2 26 26 26 23 27   GLUCOSE 181* 88 80 111* 123*  BUN 57* 55* 43* 37* 38*  CREATININE 3.13* 2.69* 2.25* 2.06* 2.04*  CALCIUM 8.6* 8.6* 8.5* 8.1* 8.4*  MG  --   --  1.9 1.8 2.0  PHOS  --   --  2.4* 2.5 2.3*   GFR: Estimated Creatinine Clearance: 21.9 mL/min (A) (by C-G formula based on SCr of 2.04 mg/dL (H)). Liver Function Tests:  Recent Labs Lab 08/06/16 1824 08/07/16 0536 08/08/16 0513 08/09/16 0534 08/10/16 0509  AST 66* 42* 31 24 23   ALT 52 41 33 26 25  ALKPHOS 123 104 110 97 96  BILITOT 3.9* 2.9* 2.7* 2.1* 1.7*  PROT 7.2 6.1* 6.1* 5.8* 6.0*  ALBUMIN 3.4* 2.9* 2.6* 2.6* 2.6*   No results for input(s): LIPASE, AMYLASE in the last 168 hours. No results for input(s): AMMONIA in the last 168 hours. Coagulation Profile:  Recent Labs Lab 08/07/16 0536 08/07/16 0704 08/08/16 0513 08/09/16 0534 08/10/16 0509  INR 4.13* 4.01* 3.62 3.99 2.86   Cardiac Enzymes: No results for input(s): CKTOTAL, CKMB, CKMBINDEX, TROPONINI in the last 168 hours. BNP (last 3 results) No results for input(s): PROBNP in the last 8760 hours. HbA1C: No results for input(s): HGBA1C in the last 72 hours. CBG:  Recent Labs Lab 08/09/16 0012 08/09/16 0805 08/09/16 2354 08/10/16 0741 08/10/16 1648  GLUCAP 152* 97 180* 101* 117*   Lipid Profile: No results for input(s): CHOL, HDL, LDLCALC, TRIG, CHOLHDL, LDLDIRECT in the last 72 hours. Thyroid Function Tests: No results for input(s): TSH, T4TOTAL, FREET4, T3FREE, THYROIDAB in the last 72 hours. Anemia Panel: No results for input(s): VITAMINB12, FOLATE, FERRITIN, TIBC, IRON, RETICCTPCT in the last 72 hours. Sepsis Labs: No results for input(s): PROCALCITON, LATICACIDVEN in the last 168 hours.  No results found for this or any previous visit (from the past 240 hour(s)).   Radiology Studies: Dg Chest  Port 1 View  Result Date: 08/10/2016 CLINICAL DATA:  Shortness of breath beginning today. EXAM: PORTABLE CHEST 1 VIEW COMPARISON:  01/05/2011 FINDINGS: Chronically enlarged cardiac silhouette that could be due to cardiomegaly and/or pericardial fluid. Aortic atherosclerosis. Bilateral effusions with atelectasis and or infiltrate in both lower lobes. Venous hypertension with mild interstitial edema. IMPRESSION: Congestive heart failure. Enlarged cardiac silhouette. Venous hypertension with mild interstitial edema. Bilateral effusions with lower lobe atelectasis and/or pneumonia. Electronically Signed   By: Paulina FusiMark  Shogry M.D.   On: 08/10/2016 12:30   Scheduled Meds: . diltiazem  120 mg Oral Daily  . febuxostat  40 mg Oral Daily  .  finasteride  5 mg Oral Daily  . latanoprost  1 drop Both Eyes QHS  . levothyroxine  50 mcg Oral QAC breakfast  . metoprolol  50 mg Oral BID  . pantoprazole  40 mg Oral Daily  . piperacillin-tazobactam (ZOSYN)  IV  3.375 g Intravenous Q8H  . pravastatin  40 mg Oral Daily  . predniSONE  5 mg Oral Daily  . tamsulosin  0.8 mg Oral QHS   Continuous Infusions:   LOS: 4 days   Merlene Laughter, DO [facility] Pager [phone]  If 7PM-7AM, please contact night-coverage [email or web address] Password [email or web address] 08/10/2016, 7:02 PM

## 2016-08-10 NOTE — Progress Notes (Signed)
Pharmacy Antibiotic Note  Olin PiaRalph Z Cookston is a 83 y.o. male admitted on 08/06/2016 with Intra-abdominal infection.  Pharmacy has been consulted for zosyn dosing.  08/10/2016 Scr 2.04, CrCl ~ 4922mls/min WBC WNL Afebrile  Plan: Increase zosyn to 3.375mg  IV q8h EI for improved renal function f/u renal function  Height: 5\' 8"  (172.7 cm) Weight: 156 lb 3.2 oz (70.9 kg) IBW/kg (Calculated) : 68.4  Temp (24hrs), Avg:98.2 F (36.8 C), Min:97.8 F (36.6 C), Max:98.5 F (36.9 C)   Recent Labs Lab 08/06/16 1824 08/07/16 0536 08/08/16 0513 08/09/16 0534 08/10/16 0509  WBC 8.8 7.1 5.7 5.3 5.8  CREATININE 3.13* 2.69* 2.25* 2.06* 2.04*    Estimated Creatinine Clearance: 21.9 mL/min (A) (by C-G formula based on SCr of 2.04 mg/dL (H)).    No Known Allergies  Antimicrobials this admission: Zosyn 08/10/2016 >>    Microbiology results: none  Thank you for allowing pharmacy to be a part of this patient's care.  Arley PhenixEllen Mylena Sedberry RPh 08/10/2016, 10:50 AM Pager [phone]

## 2016-08-10 NOTE — Progress Notes (Deleted)
  Subjective: Patient reports some incisional pain Tolerating po  Objective: Vital signs in last 24 hours: Temp:  [97.8 F (36.6 C)-98.5 F (36.9 C)] 97.8 F (36.6 C) (03/18 0408) Pulse Rate:  [59-71] 71 (03/18 0408) Resp:  [18] 18 (03/18 0408) BP: (113-135)/(64-72) 132/69 (03/18 0408) SpO2:  [92 %-98 %] 92 % (03/18 0408) Weight:  [70.9 kg (156 lb 3.2 oz)] 70.9 kg (156 lb 3.2 oz) (03/18 0543) Last BM Date: 08/06/16  Intake/Output from previous day: 03/17 0701 - 03/18 0700 In: 2670 [P.O.:720; I.V.:1800; IV Piggyback:150] Out: 800 [Urine:800] Intake/Output this shift: No intake/output data recorded.  Exam: Abdomen soft, obese, dressings dry, Drain serosang  Lab Results:   Recent Labs  08/09/16 0534 08/10/16 0509  WBC 5.3 5.8  HGB 9.3* 8.9*  HCT 29.6* 27.8*  PLT 152 154   BMET  Recent Labs  08/09/16 0534 08/10/16 0509  NA 139 142  K 3.5 4.4  CL 107 108  CO2 23 27  GLUCOSE 111* 123*  BUN 37* 38*  CREATININE 2.06* 2.04*  CALCIUM 8.1* 8.4*   PT/INR  Recent Labs  08/09/16 0534 08/10/16 0509  LABPROT 39.9* 30.6*  INR 3.99 2.86   ABG No results for input(s): PHART, HCO3 in the last 72 hours.  Invalid input(s): PCO2, PO2  Studies/Results: No results found.  Anti-infectives: Anti-infectives    Start     Dose/Rate Route Frequency Ordered Stop   08/07/16 0600  piperacillin-tazobactam (ZOSYN) IVPB 2.25 g     2.25 g 100 mL/hr over 30 Minutes Intravenous Every 6 hours 08/07/16 0332     08/06/16 2245  piperacillin-tazobactam (ZOSYN) IVPB 3.375 g     3.375 g 100 mL/hr over 30 Minutes Intravenous  Once 08/06/16 2234 08/07/16 0021      Assessment/Plan: Pt s/p robot lap chole, partial nephrectomy  From a general surg stand point, his diet can be advanced as tolerated. Nothing further for us to offer at this point.  Will see prn  LOS: 4 days    Dasia Guerrier A 08/10/2016

## 2016-08-10 NOTE — Progress Notes (Signed)
  Subjective: No complaints Tolerating a diet without abdominal pain  Objective: Vital signs in last 24 hours: Temp:  [97.8 F (36.6 C)-98.5 F (36.9 C)] 97.8 F (36.6 C) (03/18 0408) Pulse Rate:  [59-71] 71 (03/18 0408) Resp:  [18] 18 (03/18 0408) BP: (113-135)/(64-72) 132/69 (03/18 0408) SpO2:  [92 %-98 %] 92 % (03/18 0408) Weight:  [70.9 kg (156 lb 3.2 oz)] 70.9 kg (156 lb 3.2 oz) (03/18 0543) Last BM Date: 08/06/16  Intake/Output from previous day: 03/17 0701 - 03/18 0700 In: 2670 [P.O.:720; I.V.:1800; IV Piggyback:150] Out: 800 [Urine:800] Intake/Output this shift: No intake/output data recorded.  Exam: Awake and alert Abdomen soft, non tender  Lab Results:   Recent Labs  08/09/16 0534 08/10/16 0509  WBC 5.3 5.8  HGB 9.3* 8.9*  HCT 29.6* 27.8*  PLT 152 154   BMET  Recent Labs  08/09/16 0534 08/10/16 0509  NA 139 142  K 3.5 4.4  CL 107 108  CO2 23 27  GLUCOSE 111* 123*  BUN 37* 38*  CREATININE 2.06* 2.04*  CALCIUM 8.1* 8.4*   PT/INR  Recent Labs  08/09/16 0534 08/10/16 0509  LABPROT 39.9* 30.6*  INR 3.99 2.86   ABG No results for input(s): PHART, HCO3 in the last 72 hours.  Invalid input(s): PCO2, PO2  Studies/Results: No results found.  Anti-infectives: Anti-infectives    Start     Dose/Rate Route Frequency Ordered Stop   08/07/16 0600  piperacillin-tazobactam (ZOSYN) IVPB 2.25 g     2.25 g 100 mL/hr over 30 Minutes Intravenous Every 6 hours 08/07/16 0332     08/06/16 2245  piperacillin-tazobactam (ZOSYN) IVPB 3.375 g     3.375 g 100 mL/hr over 30 Minutes Intravenous  Once 08/06/16 2234 08/07/16 0021      Assessment/Plan:  Chronic cholecystitis  He is clinically improving.  Family was in the room so we discussed his gallbladder issue in detail again. They agree with continued conservative management. He may be able to be discharged in the next 24 to 48 hours.  At discharge, will have him f/u with Dr. Gerrit FriendsGerkin at our office.   Would continue oral antibiotics at discharge.  LOS: 4 days    Maudry Zeidan A 08/10/2016

## 2016-08-11 ENCOUNTER — Inpatient Hospital Stay (HOSPITAL_COMMUNITY): Payer: Medicare Other

## 2016-08-11 DIAGNOSIS — R05 Cough: Secondary | ICD-10-CM

## 2016-08-11 DIAGNOSIS — J189 Pneumonia, unspecified organism: Secondary | ICD-10-CM

## 2016-08-11 LAB — CBC WITH DIFFERENTIAL/PLATELET
BASOS ABS: 0 10*3/uL (ref 0.0–0.1)
Basophils Relative: 0 %
EOS ABS: 0.1 10*3/uL (ref 0.0–0.7)
Eosinophils Relative: 2 %
HEMATOCRIT: 31.2 % — AB (ref 39.0–52.0)
HEMOGLOBIN: 9.8 g/dL — AB (ref 13.0–17.0)
LYMPHS PCT: 10 %
Lymphs Abs: 0.7 10*3/uL (ref 0.7–4.0)
MCH: 27.1 pg (ref 26.0–34.0)
MCHC: 31.4 g/dL (ref 30.0–36.0)
MCV: 86.4 fL (ref 78.0–100.0)
MONOS PCT: 7 %
Monocytes Absolute: 0.5 10*3/uL (ref 0.1–1.0)
Neutro Abs: 6.1 10*3/uL (ref 1.7–7.7)
Neutrophils Relative %: 81 %
PLATELETS: 190 10*3/uL (ref 150–400)
RBC: 3.61 MIL/uL — AB (ref 4.22–5.81)
RDW: 15.5 % (ref 11.5–15.5)
WBC: 7.4 10*3/uL (ref 4.0–10.5)

## 2016-08-11 LAB — GLUCOSE, CAPILLARY
Glucose-Capillary: 196 mg/dL — ABNORMAL HIGH (ref 65–99)
Glucose-Capillary: 112 mg/dL — ABNORMAL HIGH (ref 65–99)
Glucose-Capillary: 100 mg/dL — ABNORMAL HIGH (ref 65–99)

## 2016-08-11 LAB — COMPREHENSIVE METABOLIC PANEL
ALT: 23 U/L (ref 17–63)
ANION GAP: 7 (ref 5–15)
AST: 25 U/L (ref 15–41)
Albumin: 2.8 g/dL — ABNORMAL LOW (ref 3.5–5.0)
Alkaline Phosphatase: 86 U/L (ref 38–126)
BUN: 35 mg/dL — ABNORMAL HIGH (ref 6–20)
CALCIUM: 8.8 mg/dL — AB (ref 8.9–10.3)
CHLORIDE: 108 mmol/L (ref 101–111)
CO2: 26 mmol/L (ref 22–32)
CREATININE: 2.25 mg/dL — AB (ref 0.61–1.24)
GFR, EST AFRICAN AMERICAN: 27 mL/min — AB (ref >60)
GFR, EST NON AFRICAN AMERICAN: 23 mL/min — AB (ref >60)
Glucose, Bld: 102 mg/dL — ABNORMAL HIGH (ref 65–99)
Potassium: 3.8 mmol/L (ref 3.5–5.1)
Sodium: 141 mmol/L (ref 135–145)
Total Bilirubin: 1.7 mg/dL — ABNORMAL HIGH (ref 0.3–1.2)
Total Protein: 6.3 g/dL — ABNORMAL LOW (ref 6.5–8.1)

## 2016-08-11 LAB — PROTIME-INR
INR: 1.98
Prothrombin Time: 22.8 seconds — ABNORMAL HIGH (ref 11.4–15.2)

## 2016-08-11 LAB — PHOSPHORUS: PHOSPHORUS: 2.5 mg/dL (ref 2.5–4.6)

## 2016-08-11 LAB — MAGNESIUM: MAGNESIUM: 2 mg/dL (ref 1.7–2.4)

## 2016-08-11 MED ORDER — FUROSEMIDE 10 MG/ML IJ SOLN
20.0000 mg | Freq: Once | INTRAMUSCULAR | Status: AC
  Administered 2016-08-11: 20 mg via INTRAVENOUS
  Filled 2016-08-11: qty 2

## 2016-08-11 MED ORDER — GUAIFENESIN ER 600 MG PO TB12
1200.0000 mg | ORAL_TABLET | Freq: Two times a day (BID) | ORAL | Status: DC
  Administered 2016-08-11 – 2016-08-14 (×7): 1200 mg via ORAL
  Filled 2016-08-11 (×7): qty 2

## 2016-08-11 MED ORDER — IPRATROPIUM-ALBUTEROL 0.5-2.5 (3) MG/3ML IN SOLN
3.0000 mL | Freq: Two times a day (BID) | RESPIRATORY_TRACT | Status: DC
  Administered 2016-08-11 – 2016-08-12 (×2): 3 mL via RESPIRATORY_TRACT
  Filled 2016-08-11 (×2): qty 3

## 2016-08-11 MED ORDER — WARFARIN - PHARMACIST DOSING INPATIENT
Freq: Every day | Status: DC
  Administered 2016-08-13: 19:00:00

## 2016-08-11 MED ORDER — WARFARIN SODIUM 2 MG PO TABS
2.0000 mg | ORAL_TABLET | Freq: Once | ORAL | Status: AC
  Administered 2016-08-11: 2 mg via ORAL
  Filled 2016-08-11: qty 1

## 2016-08-11 MED ORDER — IPRATROPIUM-ALBUTEROL 0.5-2.5 (3) MG/3ML IN SOLN
3.0000 mL | Freq: Four times a day (QID) | RESPIRATORY_TRACT | Status: DC
Start: 2016-08-11 — End: 2016-08-11
  Administered 2016-08-11: 3 mL via RESPIRATORY_TRACT
  Filled 2016-08-11: qty 3

## 2016-08-11 MED ORDER — METHYLPREDNISOLONE SODIUM SUCC 40 MG IJ SOLR
40.0000 mg | Freq: Two times a day (BID) | INTRAMUSCULAR | Status: DC
  Administered 2016-08-11 – 2016-08-12 (×3): 40 mg via INTRAVENOUS
  Filled 2016-08-11 (×3): qty 1

## 2016-08-11 MED ORDER — IPRATROPIUM-ALBUTEROL 0.5-2.5 (3) MG/3ML IN SOLN: 3.0000 mL | RESPIRATORY_TRACT | Status: DC | PRN

## 2016-08-11 NOTE — Care Management Important Message (Signed)
Important Message  Patient Details  Name: Troy [name] MRN: [id number] Date of Birth: Mar 07, 1921   Medicare Important Message Given:  Yes    Caren MacadamFuller, Bonney Berres 08/11/2016, 11:55 AMImportant Message  Patient Details  Name: Troy [name] MRN: [id number] Date of Birth: Mar 07, 1921   Medicare Important Message Given:  Yes    Caren MacadamFuller, Makarios Madlock 08/11/2016, 11:55 AM

## 2016-08-11 NOTE — Progress Notes (Signed)
Assessment: Urinary retention with hematuria - his hematuria has now cleared. His retention was likely due to the fact that he was off of his BPH medications. He will need to remain on these and will be discharged with a catheter. He will then return to my office for a voiding trial.  Plan: 1. Continue Flomax 0.8 mg upon discharge. 2. He will also need to remain on finasteride 5 mg daily. 3. He will be discharged with a Foley catheter and follow-up with me as an outpatient for voiding trial. 4. Follow-up information was placed on the chart for him.    Subjective: Patient reports no urologic complaints. He's tolerating his catheter.  Objective: Vital signs in last 24 hours: Temp:  [97.8 F (36.6 C)-98 F (36.7 C)] 98 F (36.7 C) (03/19 0450) Pulse Rate:  [53-88] 87 (03/19 0450) Resp:  [18] 18 (03/19 0450) BP: (118-140)/(65-100) 135/89 (03/19 0450) SpO2:  [95 %-99 %] 98 % (03/19 0450)A  Intake/Output from previous day: 03/18 0701 - 03/19 0700 In: 360 [P.O.:360] Out: -  Intake/Output this shift: No intake/output data recorded.  Past Medical History:  Diagnosis Date  . Chronic anticoagulation 2011   on coumadin  . Coronary artery disease 2012   PCI-LAD & LCX, Promus Des  . Echocardiogram abnormal 11/07/2010   EF 50-55%, LA mild to mod dilated, mod MR, RV SYSTOLIC PRESSure 40-50 mild-mod pul. HTN   . H/O cardiovascular stress test 11/07/2010   new scar in LCX distribution  . Hyperlipidemia LDL goal < 70   . Hypertension   . Myocardial infarction 05/23/2010   non-ST elevation MI  . Permanent atrial fibrillation (HCC)   . Pulmonary embolus (HCC) 05/23/2010  . Pulmonary hypertension    mild to moderate    Physical Exam:  Lungs - Normal respiratory effort, chest expands symmetrically.  Abdomen - Soft, non-tender & non-distended. Foley catheter draining clear urine.  Lab Results:  Recent Labs  08/09/16 0534 08/10/16 0509 08/11/16 0509  WBC 5.3 5.8 7.4  HGB 9.3*  8.9* 9.8*  HCT 29.6* 27.8* 31.2*   BMET  Recent Labs  08/10/16 0509 08/11/16 0509  NA 142 141  K 4.4 3.8  CL 108 108  CO2 27 26  GLUCOSE 123* 102*  BUN 38* 35*  CREATININE 2.04* 2.25*  CALCIUM 8.4* 8.8*   No results for input(s): LABURIN in the last 72 hours. Results for orders placed or performed during the hospital encounter of 05/23/10  MRSA PCR Screening     Status: None   Collection Time: 05/23/10 12:40 PM  Result Value Ref Range Status   MRSA by PCR  NEGATIVE Final    NEGATIVE        The GeneXpert MRSA Assay (FDA approved for NASAL specimens only), is one component of a comprehensive MRSA colonization surveillance program. It is not intended to diagnose MRSA infection nor to guide or monitor treatment for MRSA infections.    Studies/Results: Dg Chest Port 1 View  Result Date: 08/11/2016 CLINICAL DATA:  Cough EXAM: PORTABLE CHEST 1 VIEW COMPARISON:  Chest radiograph 08/10/2016 FINDINGS: Unchanged enlarged cardiomediastinal silhouette with calcific aortic atherosclerosis. There is consolidation of the retrocardiac left lung base, unchanged. There is atelectasis of the right lung base. No pneumothorax. IMPRESSION: Unchanged examination with cardiomegaly, aortic atherosclerosis and left basilar consolidation. Electronically Signed   By: Deatra RobinsonKevin  Herman M.D.   On: 08/11/2016 05:32   Dg Chest Port 1 View  Result Date: 08/10/2016 CLINICAL DATA:  Shortness of breath beginning  today. EXAM: PORTABLE CHEST 1 VIEW COMPARISON:  01/05/2011 FINDINGS: Chronically enlarged cardiac silhouette that could be due to cardiomegaly and/or pericardial fluid. Aortic atherosclerosis. Bilateral effusions with atelectasis and or infiltrate in both lower lobes. Venous hypertension with mild interstitial edema. IMPRESSION: Congestive heart failure. Enlarged cardiac silhouette. Venous hypertension with mild interstitial edema. Bilateral effusions with lower lobe atelectasis and/or pneumonia.  Electronically Signed   By: Paulina Fusi M.D.   On: 08/10/2016 12:30      Makinzi Prieur C 08/11/2016, 7:14 AM

## 2016-08-11 NOTE — Progress Notes (Signed)
PROGRESS NOTE    Troy [name]  WUJ:[id number] DOB: May 21, 1921 DOA: 08/06/2016 PCP: Pearson Grippe, MD   Brief Narrative:  Troy [name] is a 83 y.o. male with history of CAD status post stenting, atrial fibrillation, history of PE, history of hypertension hyperlipidemia and other comorbids presents to the ER because of increasing lower abdominal pain with difficulty urinating. Denies fever chills diarrhea nausea vomiting currently but daughter stated he was vomiting PTA. Had CT of Abdomen done which showed ? Colitis so General Surgery was consulted and Foley catheter was placed for his acute Urinary Retention and patient ended up with Hematuria so Urology was consulted. INR was supratherapeutic on Admission. Patient feeling well and has no complaints currently except that he started coughing again. CXR Done and showed Left Lower Lobe/Basilar Consolidation.   Assessment & Plan:   Principal Problem:   Abdominal pain Active Problems:   Long term current use of anticoagulant therapy   Permanent atrial fibrillation (HCC)   Hypertension   AKI (acute kidney injury) (HCC)   Urinary retention  HCAP Left Lower Lobe Consolidation -C/w Empric Zosyn -Patient Afebrile and No Leukocytosis -Will add Guaifenesin 1200 mg po BID, DuoNeb 3 mL RTBID, and q2hprn and Methylprednisolone 40 mg IV q12h -Will obtain SLP Evaluation to evaluate for Aspiration -Continue to Monitor   Lower Abdominal pain with Urinary obstruction and Retention, improved -S/p Insertion of Foley and now has Hematuria -Urology Following and Appreciate Recc's -C/w Foley Catheter Drainage and Hand Irrigation as necessary -Per Urology will need to continue indwelling foley catheter and patient should follow up with [facility] in 1 week after D/C for voiding Trial  -C/w Finasteride 5 mg po Daily and with Tamsulosin 0.8 mg Daily  Gross Hematuria, resolved  -As above -INR is now 1.98  Mild Chronic Cholecystitis -Had N/V prior to  Admission -Abnormal CAT scan concerning for cholecystitis as showes wall thickening and diffuse inflammatory changes about the gallbladder suggesting acute cholecystitis- on exam patient does not have any abdominal tenderness and patient denies any nausea vomiting. -HIDA Scan ordered and showes Patent CBD without evidence of biliary ductal obstructuion and suspect chronic cholecystitis as delayed gallbladder filling was observed and waxing and waning gallbladder activity such that a valid EF could not be determined -Ultrasound of GallBladder Showed Gallstones with gallbladder wall thickening and equivocal pericholecystic fluid. These are findings indicative of a degree of cholecystitis, quite possibly chronic based on hepatobiliary imaging study appearance 1 day prior. -General Surgery Consulted and Following and appreciate Recc's -Per Dr. Eliberto Ivory note Surgery will not intervene surgically as patient refused -C/w IV Zosyn for Empiric Coverage and Conservative Management with IV Abx per General Surgery recc's -> Reocmmends Abx for 2 weeks Either Augmentin or Cipro/Flagyl -Patient Afebrile and has no WBC (7.4) -Per General Surgery may be discharged in next 1-2 days and follow up with Dr. Gerrit Friends as an outpatient and continue Abx at Discharge  Acute on Chronic Renal Failure with urinary retention  -Further review reveals baseline Cr 1.3-15 -Probably secondary to Urinary Retention along with some pre-renal component.  -Patient is s/p Foley catheter placement.  -Hold lisinopril due to acute renal failure. -BUN/Cr improving and went from 55/2.69 -> 43/2.25 -> 37/2.06 -> 38/2.04 -> 35/2.25 -Repeat CMP in AM  Permanent Atrial Fbrillation  -Continue Metoprolol 50 mg po BID and Cardizem 120 mg po Daily.  -Restart Coumadin because INR was subtherapeutic (went from 4.01 -> 3.62 -> 3.99 -> 2.86 -> 1.98) -C/w Telemetry  Hypertension  -  C/w Metoprolol 50 mg po BID and Cardizem 120 mg po Daily.  -Holding  lisinopril due to acute renal failure.  -When necessary IV hydralazine for systolic blood pressure more than 160. Patient is also on Cardizem and metoprolol.  Normocytic Anemia  -Hb/Hct went from 9.9/30.5 -> 9.3/29.6 -> 8.9/27.8 -> 9.8/31.2 -Hematuria in the Foley catheter improved and Resolved.  -Closely follow CBC.  CAD status post stenting  -denies any chest pain. -C/w NTG 0.4 mg SL q265minPRN -C/w Metoprolol 50 mg po BID  Hypothyroidism -C/w Levothyroxine 50 mcg Po Daily  Hx of PE -Coumadin Held because of Supratherapeutic INR at 2.86  Cough/Volume Overload -Discontinued IVF -obtained CXR and it showed Congestive heart failure. Enlarged cardiac silhouette. Venous hypertension with mild interstitial edema. Bilateral effusions with lower lobe atelectasis and/or pneumonia. -Will give 1x dose of IV Lasix 20 mg -Treating cough as HCAP as above.   DVT prophylaxis: SCD's; INR was supratherapetic on Coumadin Code Status: FULL CODE Family Communication: No Family at bedside Disposition: Home Health PT at D/C  Consultants:   General Surgery  Urology   Procedures: HIDA Scan and Ultrasound of Abdomen  Antimicrobials:  Anti-infectives    Start     Dose/Rate Route Frequency Ordered Stop   08/10/16 1800  piperacillin-tazobactam (ZOSYN) IVPB 3.375 g     3.375 g 12.5 mL/hr over 240 Minutes Intravenous Every 8 hours 08/10/16 1051     08/07/16 0600  piperacillin-tazobactam (ZOSYN) IVPB 2.25 g  Status:  Discontinued     2.25 g 100 mL/hr over 30 Minutes Intravenous Every 6 hours 08/07/16 0332 08/10/16 1051   08/06/16 2245  piperacillin-tazobactam (ZOSYN) IVPB 3.375 g     3.375 g 100 mL/hr over 30 Minutes Intravenous  Once 08/06/16 2234 08/07/16 0021     Subjective: Seen and examined and had complaints of cough and mild SOB. No Nausea/Vomiting or Abdominal Pain. Patient was not tachypenic or using any accessory muscles to breathe but wearing supplemental O2. No CP per patient.    Objective: Vitals:   08/11/16 0821 08/11/16 0941 08/11/16 1339 08/11/16 1927  BP:   110/60   Pulse:   71   Resp:   16   Temp:   98.3 F (36.8 C)   TempSrc:   Oral   SpO2:  91% 100% 98%  Weight: 73.7 kg (162 lb 7.7 oz)     Height:        Intake/Output Summary (Last 24 hours) at 08/11/16 1957 Last data filed at 08/11/16 1846  Gross per 24 hour  Intake              820 ml  Output             1000 ml  Net             -180 ml   Filed Weights   08/09/16 0637 08/10/16 0543 08/11/16 0821  Weight: 71.1 kg (156 lb 12 oz) 70.9 kg (156 lb 3.2 oz) 73.7 kg (162 lb 7.7 oz)   Examination: Physical Exam:  Constitutional: Elderly gentleman appears younger than stated age in NAD and appears calm and comfortable Eyes: Lids and conjunctivae normal, sclerae anicteric  ENMT: External Ears, Nose appear normal. Grossly normal hearing.  Neck: Appears normal, supple, no cervical masses, normal ROM, no appreciable thyromegaly Respiratory: Diminished to auscultation with mild crackles. Mild wheezing but no rales or rhonchi. Normal respiratory effort and patient is not tachypenic. No accessory muscle use.  Cardiovascular: Irregularly Irregular, no murmurs /  rubs / gallops. S1 and S2 auscultated. No appreciable extremity edema noted. Abdomen: Soft, non-tender, non-distended. No masses palpated. No appreciable hepatosplenomegaly. Bowel sounds positive x4.  GU: Deferred. Foley draining amber urine. Musculoskeletal: No clubbing / cyanosis of digits/nails. No joint deformity upper and lower extremities.   Skin: No rashes, lesions, ulcers. Has seborrheic keratosis on face. No induration; Warm and dry.  Neurologic: CN 2-12 grossly intact with no focal deficits. Sensation intact in all 4 Extremities. Romberg sign cerebellar reflexes not assessed.  Psychiatric: Normal judgment and insight. Alert and oriented x 3. Normal mood and appropriate affect.   Data Reviewed: I have personally reviewed following labs and  imaging studies  CBC:  Recent Labs Lab 08/07/16 0536 08/08/16 0513 08/09/16 0534 08/10/16 0509 08/11/16 0509  WBC 7.1 5.7 5.3 5.8 7.4  NEUTROABS 5.8 4.3 4.1 4.6 6.1  HGB 9.3* 9.9* 9.3* 8.9* 9.8*  HCT 28.9* 30.5* 29.6* 27.8* 31.2*  MCV 83.8 86.4 86.0 87.7 86.4  PLT 143* 157 152 154 190   Basic Metabolic Panel:  Recent Labs Lab 08/07/16 0536 08/08/16 0513 08/09/16 0534 08/10/16 0509 08/11/16 0509  NA 141 141 139 142 141  K 3.5 3.6 3.5 4.4 3.8  CL 106 108 107 108 108  CO2 26 26 23 27 26   GLUCOSE 88 80 111* 123* 102*  BUN 55* 43* 37* 38* 35*  CREATININE 2.69* 2.25* 2.06* 2.04* 2.25*  CALCIUM 8.6* 8.5* 8.1* 8.4* 8.8*  MG  --  1.9 1.8 2.0 2.0  PHOS  --  2.4* 2.5 2.3* 2.5   GFR: Estimated Creatinine Clearance: 19.8 mL/min (A) (by C-G formula based on SCr of 2.25 mg/dL (H)). Liver Function Tests:  Recent Labs Lab 08/07/16 0536 08/08/16 0513 08/09/16 0534 08/10/16 0509 08/11/16 0509  AST 42* 31 24 23 25   ALT 41 33 26 25 23   ALKPHOS 104 110 97 96 86  BILITOT 2.9* 2.7* 2.1* 1.7* 1.7*  PROT 6.1* 6.1* 5.8* 6.0* 6.3*  ALBUMIN 2.9* 2.6* 2.6* 2.6* 2.8*   No results for input(s): LIPASE, AMYLASE in the last 168 hours. No results for input(s): AMMONIA in the last 168 hours. Coagulation Profile:  Recent Labs Lab 08/07/16 0704 08/08/16 0513 08/09/16 0534 08/10/16 0509 08/11/16 0509  INR 4.01* 3.62 3.99 2.86 1.98   Cardiac Enzymes: No results for input(s): CKTOTAL, CKMB, CKMBINDEX, TROPONINI in the last 168 hours. BNP (last 3 results) No results for input(s): PROBNP in the last 8760 hours. HbA1C: No results for input(s): HGBA1C in the last 72 hours. CBG:  Recent Labs Lab 08/09/16 2354 08/10/16 0741 08/10/16 1648 08/11/16 0152 08/11/16 0728  GLUCAP 180* 101* 117* 112* 100*   Lipid Profile: No results for input(s): CHOL, HDL, LDLCALC, TRIG, CHOLHDL, LDLDIRECT in the last 72 hours. Thyroid Function Tests: No results for input(s): TSH, T4TOTAL, FREET4,  T3FREE, THYROIDAB in the last 72 hours. Anemia Panel: No results for input(s): VITAMINB12, FOLATE, FERRITIN, TIBC, IRON, RETICCTPCT in the last 72 hours. Sepsis Labs: No results for input(s): PROCALCITON, LATICACIDVEN in the last 168 hours.  No results found for this or any previous visit (from the past 240 hour(s)).   Radiology Studies: Dg Chest Port 1 View  Result Date: 08/11/2016 CLINICAL DATA:  Cough EXAM: PORTABLE CHEST 1 VIEW COMPARISON:  Chest radiograph 08/10/2016 FINDINGS: Unchanged enlarged cardiomediastinal silhouette with calcific aortic atherosclerosis. There is consolidation of the retrocardiac left lung base, unchanged. There is atelectasis of the right lung base. No pneumothorax. IMPRESSION: Unchanged examination with cardiomegaly, aortic atherosclerosis  and left basilar consolidation. Electronically Signed   By: Deatra Robinson M.D.   On: 08/11/2016 05:32   Dg Chest Port 1 View  Result Date: 08/10/2016 CLINICAL DATA:  Shortness of breath beginning today. EXAM: PORTABLE CHEST 1 VIEW COMPARISON:  01/05/2011 FINDINGS: Chronically enlarged cardiac silhouette that could be due to cardiomegaly and/or pericardial fluid. Aortic atherosclerosis. Bilateral effusions with atelectasis and or infiltrate in both lower lobes. Venous hypertension with mild interstitial edema. IMPRESSION: Congestive heart failure. Enlarged cardiac silhouette. Venous hypertension with mild interstitial edema. Bilateral effusions with lower lobe atelectasis and/or pneumonia. Electronically Signed   By: Paulina Fusi M.D.   On: 08/10/2016 12:30   Scheduled Meds: . diltiazem  120 mg Oral Daily  . febuxostat  40 mg Oral Daily  . finasteride  5 mg Oral Daily  . guaiFENesin  1,200 mg Oral BID  . ipratropium-albuterol  3 mL Nebulization BID  . latanoprost  1 drop Both Eyes QHS  . levothyroxine  50 mcg Oral QAC breakfast  . methylPREDNISolone (SOLU-MEDROL) injection  40 mg Intravenous Q12H  . metoprolol  50 mg Oral BID   . pantoprazole  40 mg Oral Daily  . piperacillin-tazobactam (ZOSYN)  IV  3.375 g Intravenous Q8H  . pravastatin  40 mg Oral Daily  . tamsulosin  0.8 mg Oral QHS  . Warfarin - Pharmacist Dosing Inpatient   Does not apply q1800   Continuous Infusions:   LOS: 5 days   Merlene Laughter, DO [facility] Pager [phone]  If 7PM-7AM, please contact night-coverage [email or web address] Password [facility] 08/11/2016, 7:57 PM

## 2016-08-11 NOTE — Progress Notes (Signed)
  Subjective: No c/o. Denies abd pain. Denies n/v  Objective: Vital signs in last 24 hours: Temp:  [97.8 F (36.6 C)-98 F (36.7 C)] 98 F (36.7 C) (03/19 0450) Pulse Rate:  [53-88] 87 (03/19 0450) Resp:  [18] 18 (03/19 0450) BP: (118-140)/(65-100) 135/89 (03/19 0450) SpO2:  [91 %-99 %] 91 % (03/19 0941) Weight:  [73.7 kg (162 lb 7.7 oz)] 73.7 kg (162 lb 7.7 oz) (03/19 0821) Last BM Date: 08/06/16  Intake/Output from previous day: 03/18 0701 - 03/19 0700 In: 410 [P.O.:360; IV Piggyback:50] Out: -  Intake/Output this shift: No intake/output data recorded.  Awake, not ill appearing Soft, nd, nt  Lab Results:   Recent Labs  08/10/16 0509 08/11/16 0509  WBC 5.8 7.4  HGB 8.9* 9.8*  HCT 27.8* 31.2*  PLT 154 190   BMET  Recent Labs  08/10/16 0509 08/11/16 0509  NA 142 141  K 4.4 3.8  CL 108 108  CO2 27 26  GLUCOSE 123* 102*  BUN 38* 35*  CREATININE 2.04* 2.25*  CALCIUM 8.4* 8.8*   PT/INR  Recent Labs  08/10/16 0509 08/11/16 0509  LABPROT 30.6* 22.8*  INR 2.86 1.98   ABG No results for input(s): PHART, HCO3 in the last 72 hours.  Invalid input(s): PCO2, PO2  Studies/Results: Dg Chest Port 1 View  Result Date: 08/11/2016 CLINICAL DATA:  Cough EXAM: PORTABLE CHEST 1 VIEW COMPARISON:  Chest radiograph 08/10/2016 FINDINGS: Unchanged enlarged cardiomediastinal silhouette with calcific aortic atherosclerosis. There is consolidation of the retrocardiac left lung base, unchanged. There is atelectasis of the right lung base. No pneumothorax. IMPRESSION: Unchanged examination with cardiomegaly, aortic atherosclerosis and left basilar consolidation. Electronically Signed   By: Deatra RobinsonKevin  Herman M.D.   On: 08/11/2016 05:32   Dg Chest Port 1 View  Result Date: 08/10/2016 CLINICAL DATA:  Shortness of breath beginning today. EXAM: PORTABLE CHEST 1 VIEW COMPARISON:  01/05/2011 FINDINGS: Chronically enlarged cardiac silhouette that could be due to cardiomegaly and/or  pericardial fluid. Aortic atherosclerosis. Bilateral effusions with atelectasis and or infiltrate in both lower lobes. Venous hypertension with mild interstitial edema. IMPRESSION: Congestive heart failure. Enlarged cardiac silhouette. Venous hypertension with mild interstitial edema. Bilateral effusions with lower lobe atelectasis and/or pneumonia. Electronically Signed   By: Paulina FusiMark  Shogry M.D.   On: 08/10/2016 12:30    Anti-infectives: Anti-infectives    Start     Dose/Rate Route Frequency Ordered Stop   08/10/16 1800  piperacillin-tazobactam (ZOSYN) IVPB 3.375 g     3.375 g 12.5 mL/hr over 240 Minutes Intravenous Every 8 hours 08/10/16 1051     08/07/16 0600  piperacillin-tazobactam (ZOSYN) IVPB 2.25 g  Status:  Discontinued     2.25 g 100 mL/hr over 30 Minutes Intravenous Every 6 hours 08/07/16 0332 08/10/16 1051   08/06/16 2245  piperacillin-tazobactam (ZOSYN) IVPB 3.375 g     3.375 g 100 mL/hr over 30 Minutes Intravenous  Once 08/06/16 2234 08/07/16 0021      Assessment/Plan: Chronic cholecystitis  Tolerating diet, no wbc, no fever, abd exam benign Will cont non-op mgmt  Will need total of 2 weeks of abx (augmentin or cipro/flagyl) rec probiotic while on abx f/u with Dr Gerrit FriendsGerkin at [facility] Signing off. pls call with questions.    LOS: 5 days    Atilano InaWILSON,Bryant Saye M 08/11/2016

## 2016-08-11 NOTE — Progress Notes (Signed)
Physical Therapy Treatment Patient Details Name: Troy [name] MRN: [id number] DOB: Nov 02, 1920 Today's Date: 08/11/2016    History of Present Illness 83 y.o. male with history of CAD status post stenting, atrial fibrillation, history of PE, history of hypertension hyperlipidemia and other comorbids presents to the ER because of increasing lower abdominal pain with difficulty urinating. Dx of AKI, urinary obstruction and retention, cholecystitis, a fib.     PT Comments    Pt with limited mobility this visit d/t fatigue and headache; very unsteady with transfer, unsafe with RW, lives alone and may need higher level of care if does not progress next visit; will continue to follow  Follow Up Recommendations  Home health PT;Supervision for mobility/OOB (may need SNF if does not progress next visit)     Equipment Recommendations  None recommended by PT    Recommendations for Other Services       Precautions / Restrictions Precautions Precautions: Fall Precaution Comments: fall a few wks ago Restrictions Weight Bearing Restrictions: No    Mobility  Bed Mobility Overal bed mobility: Needs Assistance Bed Mobility: Supine to Sit     Supine to sit: Supervision     General bed mobility comments: extra time to complete, cues for safety and line awareness, HOB elevated  Transfers Overall transfer level: Needs assistance Equipment used: Rolling walker (2 wheeled) Transfers: Sit to/from [employer] Sit to Stand: Min assist Stand pivot transfers: Min assist       General transfer comment: VCs hand placement and overall safety  Ambulation/Gait Ambulation/Gait assistance: Min assist Ambulation Distance (Feet): 5 Feet Assistive device: Rolling walker (2 wheeled) Gait Pattern/deviations: Step-through pattern;Trunk flexed;Decreased stride length     General Gait Details: VCs for RW position and for posture;  assist for balance; pt unsteady, requiring heavy min  assist for pivotal steps to chair   Stairs            Wheelchair Mobility    Modified Rankin (Stroke Patients Only)       Balance Overall balance assessment: Needs assistance;History of Falls           Standing balance-Leahy Scale: Poor Standing balance comment: reliant on UEs for safe standing                     Cognition Arousal/Alertness: Awake/alert Behavior During Therapy: WFL for tasks assessed/performed Overall Cognitive Status: Within Functional Limits for tasks assessed                 General Comments: generally sleepy    Exercises General Exercises - Lower Extremity Ankle Circles/Pumps: AROM;Both;10 reps Quad Sets: AROM;Strengthening;Both;10 reps Long Arc Quad: AROM;Strengthening;Both;10 reps;Seated    General Comments        Pertinent Vitals/Pain Pain Assessment: 0-10 Pain Score: 4  Pain Location: head Pain Descriptors / Indicators: Aching Pain Intervention(s): Monitored during session;Premedicated before session    Home Living                      Prior Function            PT Goals (current goals can now be found in the care plan section) Acute Rehab PT Goals Patient Stated Goal: DC home PT Goal Formulation: With patient Time For Goal Achievement: 08/23/16 Potential to Achieve Goals: Good    Frequency    Min 3X/week      PT Plan Current plan remains appropriate    Co-evaluation  End of Session Equipment Utilized During Treatment: Gait belt Activity Tolerance: Patient limited by fatigue Patient left: in chair;with call bell/phone within reach;with chair alarm set   PT Visit Diagnosis: History of falling (Z91.81);Other abnormalities of gait and mobility (R26.89)     Time: 1550-1603 PT Time Calculation (min) (ACUTE ONLY): 13 min  Charges:  $Therapeutic Activity: 8-22 mins                    G Codes:       Dyana Magner 08/11/2016, 4:16 PM

## 2016-08-11 NOTE — Progress Notes (Signed)
ANTICOAGULATION CONSULT NOTE - Follow Up Consult  Pharmacy Consult for warfarin Indication: DVT  No Known Allergies  Patient Measurements: Height: 5\' 8"  (172.7 cm) Weight: 162 lb 7.7 oz (73.7 kg) IBW/kg (Calculated) : 68.4 Heparin Dosing Weight:   Vital Signs: Temp: 98.3 F (36.8 C) (03/19 1339) Temp Source: Oral (03/19 1339) BP: 110/60 (03/19 1339) Pulse Rate: 71 (03/19 1339)  Labs:  Recent Labs  08/09/16 0534 08/10/16 0509 08/11/16 0509  HGB 9.3* 8.9* 9.8*  HCT 29.6* 27.8* 31.2*  PLT 152 154 190  LABPROT 39.9* 30.6* 22.8*  INR 3.99 2.86 1.98  CREATININE 2.06* 2.04* 2.25*    Estimated Creatinine Clearance: 19.8 mL/min (A) (by C-G formula based on SCr of 2.25 mg/dL (H)).   Medications:  Scheduled:  . diltiazem  120 mg Oral Daily  . febuxostat  40 mg Oral Daily  . finasteride  5 mg Oral Daily  . guaiFENesin  1,200 mg Oral BID  . ipratropium-albuterol  3 mL Nebulization BID  . latanoprost  1 drop Both Eyes QHS  . levothyroxine  50 mcg Oral QAC breakfast  . methylPREDNISolone (SOLU-MEDROL) injection  40 mg Intravenous Q12H  . metoprolol  50 mg Oral BID  . pantoprazole  40 mg Oral Daily  . piperacillin-tazobactam (ZOSYN)  IV  3.375 g Intravenous Q8H  . pravastatin  40 mg Oral Daily  . tamsulosin  0.8 mg Oral QHS    Assessment: Pharmacy is consulted to dose warfarin in 83 yo male with PMH of afib and PE and was on warfarin PTA. INR was elevated on  Admission. Warfarin was also held due to hematuria, which has now cleared.   Pt home regimen was 2.5 mg PO daily except 1.25 mg on Sunday, Tuesday, and Friday.  Today, 08/11/16  Pt hematuria has now resolved per notes  Hgb 9.8 low but stable. plt 190  INR 1.98, Pt 22.8  No bleeding issues noted   Goal of Therapy:  INR 2-3 Monitor platelets by anticoagulation protocol: Yes   Plan:   Warfarin 2 mg PO x1   Monitor for signs and symptoms of bleeding  INR ordered for AM  Adalberto ColeNikola Antonette Hendricks, PharmD,  BCPS Pager [phone] 08/11/2016 2:06 PM

## 2016-08-12 ENCOUNTER — Inpatient Hospital Stay (HOSPITAL_COMMUNITY): Payer: Medicare Other

## 2016-08-12 DIAGNOSIS — J181 Lobar pneumonia, unspecified organism: Secondary | ICD-10-CM

## 2016-08-12 LAB — COMPREHENSIVE METABOLIC PANEL
ALK PHOS: 90 U/L (ref 38–126)
ALT: 25 U/L (ref 17–63)
AST: 30 U/L (ref 15–41)
Albumin: 2.9 g/dL — ABNORMAL LOW (ref 3.5–5.0)
Anion gap: 9 (ref 5–15)
BUN: 37 mg/dL — ABNORMAL HIGH (ref 6–20)
CALCIUM: 9.1 mg/dL (ref 8.9–10.3)
CO2: 27 mmol/L (ref 22–32)
CREATININE: 2.25 mg/dL — AB (ref 0.61–1.24)
Chloride: 104 mmol/L (ref 101–111)
GFR, EST AFRICAN AMERICAN: 27 mL/min — AB (ref >60)
GFR, EST NON AFRICAN AMERICAN: 23 mL/min — AB (ref >60)
Glucose, Bld: 193 mg/dL — ABNORMAL HIGH (ref 65–99)
Potassium: 5 mmol/L (ref 3.5–5.1)
Sodium: 140 mmol/L (ref 135–145)
Total Bilirubin: 1.8 mg/dL — ABNORMAL HIGH (ref 0.3–1.2)
Total Protein: 6.9 g/dL (ref 6.5–8.1)

## 2016-08-12 LAB — CBC WITH DIFFERENTIAL/PLATELET
BASOS PCT: 0 %
Basophils Absolute: 0 10*3/uL (ref 0.0–0.1)
Eosinophils Absolute: 0 10*3/uL (ref 0.0–0.7)
Eosinophils Relative: 0 %
HEMATOCRIT: 31.8 % — AB (ref 39.0–52.0)
HEMOGLOBIN: 10.1 g/dL — AB (ref 13.0–17.0)
LYMPHS PCT: 6 %
Lymphs Abs: 0.4 10*3/uL — ABNORMAL LOW (ref 0.7–4.0)
MCH: 27.9 pg (ref 26.0–34.0)
MCHC: 31.8 g/dL (ref 30.0–36.0)
MCV: 87.8 fL (ref 78.0–100.0)
MONOS PCT: 1 %
Monocytes Absolute: 0.1 10*3/uL (ref 0.1–1.0)
NEUTROS ABS: 6.4 10*3/uL (ref 1.7–7.7)
Neutrophils Relative %: 93 %
Platelets: 188 10*3/uL (ref 150–400)
RBC: 3.62 MIL/uL — ABNORMAL LOW (ref 4.22–5.81)
RDW: 15.6 % — ABNORMAL HIGH (ref 11.5–15.5)
WBC: 6.9 10*3/uL (ref 4.0–10.5)

## 2016-08-12 LAB — GLUCOSE, CAPILLARY
Glucose-Capillary: 168 mg/dL — ABNORMAL HIGH (ref 65–99)
Glucose-Capillary: 176 mg/dL — ABNORMAL HIGH (ref 65–99)
Glucose-Capillary: 227 mg/dL — ABNORMAL HIGH (ref 65–99)
Glucose-Capillary: 312 mg/dL — ABNORMAL HIGH (ref 65–99)

## 2016-08-12 LAB — PROTIME-INR
INR: 1.71
PROTHROMBIN TIME: 20.3 s — AB (ref 11.4–15.2)

## 2016-08-12 LAB — PHOSPHORUS: PHOSPHORUS: 3.3 mg/dL (ref 2.5–4.6)

## 2016-08-12 LAB — MAGNESIUM: MAGNESIUM: 1.9 mg/dL (ref 1.7–2.4)

## 2016-08-12 MED ORDER — AMOXICILLIN-POT CLAVULANATE 500-125 MG PO TABS
1.0000 | ORAL_TABLET | Freq: Two times a day (BID) | ORAL | Status: DC
  Administered 2016-08-12 – 2016-08-14 (×5): 500 mg via ORAL
  Filled 2016-08-12 (×6): qty 1

## 2016-08-12 MED ORDER — WARFARIN SODIUM 2.5 MG PO TABS
2.5000 mg | ORAL_TABLET | Freq: Once | ORAL | Status: AC
  Administered 2016-08-12: 2.5 mg via ORAL
  Filled 2016-08-12: qty 1

## 2016-08-12 MED ORDER — METHYLPREDNISOLONE SODIUM SUCC 40 MG IJ SOLR
40.0000 mg | Freq: Every day | INTRAMUSCULAR | Status: DC
  Administered 2016-08-13 – 2016-08-14 (×2): 40 mg via INTRAVENOUS
  Filled 2016-08-12 (×2): qty 1

## 2016-08-12 MED ORDER — FUROSEMIDE 10 MG/ML IJ SOLN
20.0000 mg | Freq: Once | INTRAMUSCULAR | Status: AC
  Administered 2016-08-12: 20 mg via INTRAVENOUS
  Filled 2016-08-12: qty 2

## 2016-08-12 MED ORDER — INSULIN ASPART 100 UNIT/ML ~~LOC~~ SOLN
0.0000 [IU] | Freq: Three times a day (TID) | SUBCUTANEOUS | Status: DC
  Administered 2016-08-13: 5 [IU] via SUBCUTANEOUS
  Administered 2016-08-13 (×2): 3 [IU] via SUBCUTANEOUS
  Administered 2016-08-14: 5 [IU] via SUBCUTANEOUS
  Administered 2016-08-14: 3 [IU] via SUBCUTANEOUS

## 2016-08-12 NOTE — Evaluation (Signed)
Clinical/Bedside Swallow Evaluation Patient Details  Name: Troy [name] MRN: [id number] Date of Birth: 07/04/1920  Today's Date: 08/12/2016 Time: SLP Start Time (ACUTE ONLY): 1135 SLP Stop Time (ACUTE ONLY): 1155 SLP Time Calculation (min) (ACUTE ONLY): 20 min  Past Medical History:  Past Medical History:  Diagnosis Date  . Chronic anticoagulation 2011   on coumadin  . Coronary artery disease 2012   PCI-LAD & LCX, Promus Des  . Echocardiogram abnormal 11/07/2010   EF 50-55%, LA mild to mod dilated, mod MR, RV SYSTOLIC PRESSure 40-50 mild-mod pul. HTN   . H/O cardiovascular stress test 11/07/2010   new scar in LCX distribution  . Hyperlipidemia LDL goal < 70   . Hypertension   . Myocardial infarction 05/23/2010   non-ST elevation MI  . Permanent atrial fibrillation (HCC)   . Pulmonary embolus (HCC) 05/23/2010  . Pulmonary hypertension    mild to moderate   Past Surgical History:  Past Surgical History:  Procedure Laterality Date  . CARDIAC CATHETERIZATION  05/23/10   high grade LAD and circumflex disease, EF 50% stented circumflex w/ Promus drug-eluding stent  . CARDIAC CATHETERIZATION  05/28/2010   LAD sten w/ Promus drug eluding stent  . CORONARY ANGIOPLASTY WITH STENT PLACEMENT     HPI:  83 yo male adm to [facility] with n/v - found to have chronic cholecystitis - Pt with PMH + for CAD, PE, HTN, Afib, decreased urination.  BSE ordered as pt with left base consolidation on CXR.    Assessment / Plan / Recommendation Clinical Impression  Pt presents with functional oropharyngeal swallow ability.  No indications of dysphagia with pt self feeding. Pt eats slowly and does report h/o esophageal diliation in the 1970s x2 but states he has not required anything further.  Recommend continue diet - advise regular/thin to allow pt to choose foods he can tolerate.  NO SLP follow up indicated, thanks for this referral.  SLP Visit Diagnosis: Dysphagia, oral phase (R13.11)    Aspiration Risk  Mild aspiration risk    Diet Recommendation Regular;Thin liquid   Liquid Administration via: Cup;Straw Medication Administration: Whole meds with liquid Supervision: Patient able to self feed Compensations: Minimize environmental distractions;Slow rate;Small sips/bites    Other  Recommendations Oral Care Recommendations: Oral care BID   Follow up Recommendations None      Frequency and Duration   n/a         Prognosis   n/a     Swallow Study   General Date of Onset: 08/12/16 HPI: 83 yo male adm to [facility] with n/v - found to have chronic cholecystitis - Pt with PMH + for CAD, PE, HTN, Afib, decreased urination.  BSE ordered as pt with left base consolidation on CXR.  Type of Study: Bedside Swallow Evaluation Diet Prior to this Study: Dysphagia 3 (soft);Thin liquids Temperature Spikes Noted: No Respiratory Status: Nasal cannula History of Recent Intubation: No Behavior/Cognition: Alert;Cooperative;Pleasant mood Oral Cavity Assessment: Within Functional Limits Oral Care Completed by SLP: No Oral Cavity - Dentition: Adequate natural dentition (some missing, lower partial) Vision: Functional for self-feeding Self-Feeding Abilities: Able to feed self Patient Positioning: Upright in bed Baseline Vocal Quality: Normal Volitional Cough: Strong Volitional Swallow: Able to elicit    Oral/Motor/Sensory Function Overall Oral Motor/Sensory Function: Within functional limits   Ice Chips Ice chips: Not tested   Thin Liquid Thin Liquid: Within functional limits Presentation: Cup;Straw;Self Fed    Nectar Thick Nectar Thick Liquid: Not tested   Honey Thick Honey  Thick Liquid: Not tested   Puree Puree: Within functional limits Presentation: Self Fed;Spoon   Solid   GO   Solid: Within functional limits Presentation: [address]elf Fed        Mickie BailKimball, Misako Roeder Ann Jossue Rubenstein HartwickKimball, [place]MS [facility]CCC SLP [phone]

## 2016-08-12 NOTE — Progress Notes (Signed)
ANTICOAGULATION CONSULT NOTE - Follow Up Consult  Pharmacy Consult for warfarin Indication: DVT  No Known Allergies  Patient Measurements: Height: 5\' 8"  (172.7 cm) Weight: 161 lb 2.5 oz (73.1 kg) IBW/kg (Calculated) : 68.4 Heparin Dosing Weight:   Vital Signs: Temp: 98 F (36.7 C) (03/20 0620) Temp Source: Oral (03/20 0620) BP: 113/62 (03/20 0620) Pulse Rate: 79 (03/20 0916)  Labs:  Recent Labs  08/10/16 0509 08/11/16 0509 08/12/16 0612  HGB 8.9* 9.8* 10.1*  HCT 27.8* 31.2* 31.8*  PLT 154 190 188  LABPROT 30.6* 22.8* 20.3*  INR 2.86 1.98 1.71  CREATININE 2.04* 2.25* 2.25*    Estimated Creatinine Clearance: 19.8 mL/min (A) (by C-G formula based on SCr of 2.25 mg/dL (H)).   Medications:  Scheduled:  . amoxicillin-clavulanate  1 tablet Oral BID  . diltiazem  120 mg Oral Daily  . febuxostat  40 mg Oral Daily  . finasteride  5 mg Oral Daily  . guaiFENesin  1,200 mg Oral BID  . latanoprost  1 drop Both Eyes QHS  . levothyroxine  50 mcg Oral QAC breakfast  . methylPREDNISolone (SOLU-MEDROL) injection  40 mg Intravenous Q12H  . metoprolol  50 mg Oral BID  . pantoprazole  40 mg Oral Daily  . pravastatin  40 mg Oral Daily  . tamsulosin  0.8 mg Oral QHS  . Warfarin - Pharmacist Dosing Inpatient   Does not apply q1800    Assessment: Pharmacy is consulted to dose warfarin in 83 yo male with PMH of afib and PE and was on warfarin PTA. INR was elevated on  Admission. Warfarin was also held due to hematuria, which has now cleared.   Pt home regimen was 2.5 mg PO daily except 1.25 mg on Sunday, Tuesday, and Friday.  Today, 08/12/16  Pt hematuria has now resolved per notes  Hgb 10.1 low but stable. plt 190  INR 1.71 subtherapeutic, Pt 20.3  No bleeding issues noted   Goal of Therapy:  INR 2-3 Monitor platelets by anticoagulation protocol: Yes   Plan:   Warfarin 2.5 mg PO x1   Monitor for signs and symptoms of bleeding  INR ordered for AM  Adalberto ColeNikola  Roslyn Else, PharmD, BCPS Pager [phone] 08/12/2016 1:34 PM

## 2016-08-12 NOTE — Progress Notes (Signed)
PROGRESS NOTE    Troy [name]  ZOX:[id number] DOB: December 25, 1920 DOA: 08/06/2016 PCP: Pearson Grippe, MD   Brief Narrative:  Troy [name] is a 83 y.o. male with history of CAD status post stenting, atrial fibrillation, history of PE, history of hypertension hyperlipidemia and other comorbids presents to the ER because of increasing lower abdominal pain with difficulty urinating. Denies fever chills diarrhea nausea vomiting currently but daughter stated he was vomiting PTA. Had CT of Abdomen done which showed ? Colitis so General Surgery was consulted and Foley catheter was placed for his acute Urinary Retention and patient ended up with Hematuria so Urology was consulted. INR was supratherapeutic on Admission. Patient feeling well and has no complaints currently except that he started coughing again. CXR Done and showed Left Lower Lobe/Basilar Consolidation. Patient steadily improving.  Assessment & Plan:   Principal Problem:   Abdominal pain Active Problems:   Long term current use of anticoagulant therapy   Permanent atrial fibrillation (HCC)   Hypertension   AKI (acute kidney injury) (HCC)   Urinary retention  HCAP Left Lower Lobe Consolidation -Changed Empric Zosyn to Augmentin 500 mg po BID -Patient Afebrile and No Leukocytosis -Will add Guaifenesin 1200 mg po BID, DuoNeb 3 mL RTBID, and q2hprn and Methylprednisolone 40 mg IV q12h; -Started Novolog SSI as Hyperglycemia from IV Steroids -SLP Evaluation to evaluate for Aspiration; Mild Aspiration Risk will need Regular Diet Thin Liquids -Gave another 1x dose of IV Lasix -Continue to Monitor  -Repeat CXR in AM and if stable can D/C home  Lower Abdominal pain with Urinary obstruction and Retention, improved -S/p Insertion of Foley and now has Hematuria -Urology Following and Appreciate Recc's -C/w Foley Catheter Drainage and Hand Irrigation as necessary -Per Urology will need to continue indwelling foley catheter and patient should  follow up with [facility] in 1 week after D/C for voiding Trial  -C/w Finasteride 5 mg po Daily and with Tamsulosin 0.8 mg Daily  Gross Hematuria, resolved  -As above -INR is now 1.71  Mild Chronic Cholecystitis -Had N/V prior to Admission -Abnormal CAT scan concerning for cholecystitis as showes wall thickening and diffuse inflammatory changes about the gallbladder suggesting acute cholecystitis- on exam patient does not have any abdominal tenderness and patient denies any nausea vomiting. -HIDA Scan ordered and showes Patent CBD without evidence of biliary ductal obstructuion and suspect chronic cholecystitis as delayed gallbladder filling was observed and waxing and waning gallbladder activity such that a valid EF could not be determined -Ultrasound of GallBladder Showed Gallstones with gallbladder wall thickening and equivocal pericholecystic fluid. These are findings indicative of a degree of cholecystitis, quite possibly chronic based on hepatobiliary imaging study appearance 1 day prior. -General Surgery Consulted and Following and appreciate Recc's -Per Dr. Eliberto Ivory note Surgery will not intervene surgically as patient refused -D/C'd IV Zosyn for Empiric Coverage and changed to Augmentin for 2 weeks total  -Patient Afebrile and has no WBC (6.9) -Per General Surgery may be discharged in next 1-2 days and follow up with Dr. Gerrit Friends as an outpatient and continue Abx at Discharge  Acute on Chronic Renal Failure with urinary retention  -Further review reveals baseline Cr 1.3-15 -Probably secondary to Urinary Retention along with some pre-renal component.  -Patient is s/p Foley catheter placement.  -Hold lisinopril due to acute renal failure. -BUN/Cr improving and went from 55/2.69 -> 43/2.25 -> 37/2.06 -> 38/2.04 -> 35/2.25 -> 37/2.25 -Repeat CMP in AM  Permanent Atrial Fbrillation  -Continue Metoprolol 50  mg po BID and Cardizem 120 mg po Daily.  -Restart Coumadin because INR  was subtherapeutic (went from 4.01 -> 3.62 -> 3.99 -> 2.86 -> 1.98 -> 1.71) -C/w Telemetry  Hypertension  -C/w Metoprolol 50 mg po BID and Cardizem 120 mg po Daily.  -Holding lisinopril due to acute renal failure.  -When necessary IV hydralazine for systolic blood pressure more than 160. Patient is also on Cardizem and metoprolol.  Normocytic Anemia  -Hb/Hct went from 9.9/30.5 -> 9.3/29.6 -> 8.9/27.8 -> 9.8/31.2 -> 10.1/31.8 -Hematuria in the Foley catheter improved and Resolved.  -Closely follow CBC.  CAD status post stenting  -denies any chest pain. -C/w NTG 0.4 mg SL q495minPRN -C/w Metoprolol 50 mg po BID  Hypothyroidism -C/w Levothyroxine 50 mcg Po Daily  Hx of PE -Coumadin restarted as INR was 1.71.; Pharmacy to Dose and will give 2.5 mg po x 1 dose  Cough/Volume Overload -Discontinued IVF -CXR yesterday showed Congestive heart failure. Enlarged cardiac silhouette. Venous hypertension with mild interstitial edema. Bilateral effusions with lower lobe atelectasis and/or pneumonia. -Will give another 1x dose of IV Lasix 20 mg -Treating cough as HCAP as above.   Hyperglycemia in the setting of IV Steroid Use -Decease IV Steroids to 40 mg Daily -Started Moderate Novolog SSI AC -Continue to Monitor CBG's  DVT prophylaxis: SCD's; Restarted Home Coumadin Code Status: FULL CODE Family Communication: No Family at bedside Disposition: Home Health PT at D/C in AM  Consultants:   General Surgery  Urology   Procedures: HIDA Scan and Ultrasound of Abdomen  Antimicrobials:  Anti-infectives    Start     Dose/Rate Route Frequency Ordered Stop   08/12/16 1200  amoxicillin-clavulanate (AUGMENTIN) 500-125 MG per tablet 500 mg     1 tablet Oral 2 times daily 08/12/16 1005     08/10/16 1800  piperacillin-tazobactam (ZOSYN) IVPB 3.375 g  Status:  Discontinued     3.375 g 12.5 mL/hr over 240 Minutes Intravenous Every 8 hours 08/10/16 1051 08/12/16 1004   08/07/16 0600   piperacillin-tazobactam (ZOSYN) IVPB 2.25 g  Status:  Discontinued     2.25 g 100 mL/hr over 30 Minutes Intravenous Every 6 hours 08/07/16 0332 08/10/16 1051   08/06/16 2245  piperacillin-tazobactam (ZOSYN) IVPB 3.375 g     3.375 g 100 mL/hr over 30 Minutes Intravenous  Once 08/06/16 2234 08/07/16 0021     Subjective: Seen and examined and thinks SOB was improved. Speech swallow screen done today and patient can tolerate regular diet. States he feels a little bit better. Will give 1 more dose of IV lasix.    Objective: Vitals:   08/11/16 2148 08/12/16 0620 08/12/16 0916 08/12/16 1547  BP: 132/71 113/62  111/60  Pulse: 88 69 79 63  Resp: 18 18 16 16   Temp: 98.2 F (36.8 C) 98 F (36.7 C)  97.3 F (36.3 C)  TempSrc: Oral Oral  Oral  SpO2: 100% 100% 98% 99%  Weight:  73.1 kg (161 lb 2.5 oz)    Height:        Intake/Output Summary (Last 24 hours) at 08/12/16 1727 Last data filed at 08/12/16 1700  Gross per 24 hour  Intake             1940 ml  Output             2350 ml  Net             -410 ml   Filed Weights   08/10/16 0543 08/11/16  0981 08/12/16 1914  Weight: 70.9 kg (156 lb 3.2 oz) 73.7 kg (162 lb 7.7 oz) 73.1 kg (161 lb 2.5 oz)   Examination: Physical Exam:  Constitutional: Elderly gentleman appears younger than stated age in NAD and appears calm and comfortable Eyes: Lids and conjunctivae normal, sclerae anicteric  ENMT: External Ears, Nose appear normal. Grossly normal hearing.  Neck: Appears normal, supple, no cervical masses, normal ROM, no appreciable thyromegaly Respiratory: Diminished to auscultation with mild crackles. Mild wheezing but no rales or rhonchi. Normal respiratory effort and patient is not tachypenic. No accessory muscle use.  Cardiovascular: Irregularly Irregular, no murmurs / rubs / gallops. S1 and S2 auscultated. No appreciable extremity edema noted. Abdomen: Soft, non-tender, non-distended. No masses palpated. No appreciable hepatosplenomegaly.  Bowel sounds positive x4.  GU: Deferred. Foley draining amber urine. Musculoskeletal: No clubbing / cyanosis of digits/nails. No joint deformity upper and lower extremities.   Skin: No rashes, lesions, ulcers. Has seborrheic keratosis on face. No induration; Warm and dry.  Neurologic: CN 2-12 grossly intact with no focal deficits. Sensation intact in all 4 Extremities. Romberg sign cerebellar reflexes not assessed.  Psychiatric: Normal judgment and insight. Alert and oriented x 3. Normal mood and appropriate affect.   Data Reviewed: I have personally reviewed following labs and imaging studies  CBC:  Recent Labs Lab 08/08/16 0513 08/09/16 0534 08/10/16 0509 08/11/16 0509 08/12/16 0612  WBC 5.7 5.3 5.8 7.4 6.9  NEUTROABS 4.3 4.1 4.6 6.1 6.4  HGB 9.9* 9.3* 8.9* 9.8* 10.1*  HCT 30.5* 29.6* 27.8* 31.2* 31.8*  MCV 86.4 86.0 87.7 86.4 87.8  PLT 157 152 154 190 188   Basic Metabolic Panel:  Recent Labs Lab 08/08/16 0513 08/09/16 0534 08/10/16 0509 08/11/16 0509 08/12/16 0612  NA 141 139 142 141 140  K 3.6 3.5 4.4 3.8 5.0  CL 108 107 108 108 104  CO2 26 23 27 26 27   GLUCOSE 80 111* 123* 102* 193*  BUN 43* 37* 38* 35* 37*  CREATININE 2.25* 2.06* 2.04* 2.25* 2.25*  CALCIUM 8.5* 8.1* 8.4* 8.8* 9.1  MG 1.9 1.8 2.0 2.0 1.9  PHOS 2.4* 2.5 2.3* 2.5 3.3   GFR: Estimated Creatinine Clearance: 19.8 mL/min (A) (by C-G formula based on SCr of 2.25 mg/dL (H)). Liver Function Tests:  Recent Labs Lab 08/08/16 0513 08/09/16 0534 08/10/16 0509 08/11/16 0509 08/12/16 0612  AST 31 24 23 25 30   ALT 33 26 25 23 25   ALKPHOS 110 97 96 86 90  BILITOT 2.7* 2.1* 1.7* 1.7* 1.8*  PROT 6.1* 5.8* 6.0* 6.3* 6.9  ALBUMIN 2.6* 2.6* 2.6* 2.8* 2.9*   No results for input(s): LIPASE, AMYLASE in the last 168 hours. No results for input(s): AMMONIA in the last 168 hours. Coagulation Profile:  Recent Labs Lab 08/08/16 0513 08/09/16 0534 08/10/16 0509 08/11/16 0509 08/12/16 0612  INR 3.62  3.99 2.86 1.98 1.71   Cardiac Enzymes: No results for input(s): CKTOTAL, CKMB, CKMBINDEX, TROPONINI in the last 168 hours. BNP (last 3 results) No results for input(s): PROBNP in the last 8760 hours. HbA1C: No results for input(s): HGBA1C in the last 72 hours. CBG:  Recent Labs Lab 08/11/16 0152 08/11/16 0728 08/12/16 0037 08/12/16 0802 08/12/16 1542  GLUCAP 112* 100* 227* 176* 312*   Lipid Profile: No results for input(s): CHOL, HDL, LDLCALC, TRIG, CHOLHDL, LDLDIRECT in the last 72 hours. Thyroid Function Tests: No results for input(s): TSH, T4TOTAL, FREET4, T3FREE, THYROIDAB in the last 72 hours. Anemia Panel: No results for  input(s): VITAMINB12, FOLATE, FERRITIN, TIBC, IRON, RETICCTPCT in the last 72 hours. Sepsis Labs: No results for input(s): PROCALCITON, LATICACIDVEN in the last 168 hours.  No results found for this or any previous visit (from the past 240 hour(s)).   Radiology Studies: Dg Chest Port 1 View  Result Date: 08/12/2016 CLINICAL DATA:  Pneumonia. EXAM: PORTABLE CHEST 1 VIEW COMPARISON:  Radiographs of August 11, 2016. FINDINGS: Stable cardiomegaly. Atherosclerosis of thoracic aorta is noted. No pneumothorax is noted. Stable bibasilar opacities are noted concerning for edema or atelectasis with probable associated pleural effusions. Bony thorax is unremarkable. IMPRESSION: Aortic atherosclerosis. Stable bibasilar edema or atelectasis is noted with probable associated pleural effusions. Electronically Signed   By: Lupita Raider, M.D.   On: 08/12/2016 07:39   Dg Chest Port 1 View  Result Date: 08/11/2016 CLINICAL DATA:  Cough EXAM: PORTABLE CHEST 1 VIEW COMPARISON:  Chest radiograph 08/10/2016 FINDINGS: Unchanged enlarged cardiomediastinal silhouette with calcific aortic atherosclerosis. There is consolidation of the retrocardiac left lung base, unchanged. There is atelectasis of the right lung base. No pneumothorax. IMPRESSION: Unchanged examination with  cardiomegaly, aortic atherosclerosis and left basilar consolidation. Electronically Signed   By: Deatra Robinson M.D.   On: 08/11/2016 05:32   Scheduled Meds: . amoxicillin-clavulanate  1 tablet Oral BID  . diltiazem  120 mg Oral Daily  . febuxostat  40 mg Oral Daily  . finasteride  5 mg Oral Daily  . guaiFENesin  1,200 mg Oral BID  . [START ON 08/13/2016] insulin aspart  0-15 Units Subcutaneous TID WC  . latanoprost  1 drop Both Eyes QHS  . levothyroxine  50 mcg Oral QAC breakfast  . methylPREDNISolone (SOLU-MEDROL) injection  40 mg Intravenous Q12H  . metoprolol  50 mg Oral BID  . pantoprazole  40 mg Oral Daily  . pravastatin  40 mg Oral Daily  . tamsulosin  0.8 mg Oral QHS  . Warfarin - Pharmacist Dosing Inpatient   Does not apply q1800   Continuous Infusions:   LOS: 6 days   Merlene Laughter, DO [facility] Pager [phone]  If 7PM-7AM, please contact night-coverage [email or web address] Password [facility] 08/12/2016, 5:27 PM

## 2016-08-13 ENCOUNTER — Inpatient Hospital Stay (HOSPITAL_COMMUNITY): Payer: Medicare Other

## 2016-08-13 DIAGNOSIS — J90 Pleural effusion, not elsewhere classified: Secondary | ICD-10-CM

## 2016-08-13 LAB — COMPREHENSIVE METABOLIC PANEL
ALBUMIN: 2.6 g/dL — AB (ref 3.5–5.0)
ALT: 26 U/L (ref 17–63)
ANION GAP: 8 (ref 5–15)
AST: 30 U/L (ref 15–41)
Alkaline Phosphatase: 70 U/L (ref 38–126)
BILIRUBIN TOTAL: 1.4 mg/dL — AB (ref 0.3–1.2)
BUN: 48 mg/dL — AB (ref 6–20)
CO2: 26 mmol/L (ref 22–32)
Calcium: 8.9 mg/dL (ref 8.9–10.3)
Chloride: 103 mmol/L (ref 101–111)
Creatinine, Ser: 2.42 mg/dL — ABNORMAL HIGH (ref 0.61–1.24)
GFR calc Af Amer: 25 mL/min — ABNORMAL LOW (ref >60)
GFR calc non Af Amer: 22 mL/min — ABNORMAL LOW (ref >60)
GLUCOSE: 171 mg/dL — AB (ref 65–99)
POTASSIUM: 4.1 mmol/L (ref 3.5–5.1)
Sodium: 137 mmol/L (ref 135–145)
TOTAL PROTEIN: 5.9 g/dL — AB (ref 6.5–8.1)

## 2016-08-13 LAB — GLUCOSE, CAPILLARY
GLUCOSE-CAPILLARY: 201 mg/dL — AB (ref 65–99)
GLUCOSE-CAPILLARY: 165 mg/dL — AB (ref 65–99)
GLUCOSE-CAPILLARY: 161 mg/dL — AB (ref 65–99)
Glucose-Capillary: 136 mg/dL — ABNORMAL HIGH (ref 65–99)

## 2016-08-13 LAB — CBC WITH DIFFERENTIAL/PLATELET
Basophils Absolute: 0 10*3/uL (ref 0.0–0.1)
Basophils Relative: 0 %
EOS ABS: 0 10*3/uL (ref 0.0–0.7)
EOS PCT: 0 %
HCT: 29.2 % — ABNORMAL LOW (ref 39.0–52.0)
Hemoglobin: 9 g/dL — ABNORMAL LOW (ref 13.0–17.0)
LYMPHS ABS: 0.5 10*3/uL — AB (ref 0.7–4.0)
LYMPHS PCT: 5 %
MCH: 26.4 pg (ref 26.0–34.0)
MCHC: 30.8 g/dL (ref 30.0–36.0)
MCV: 85.6 fL (ref 78.0–100.0)
MONOS PCT: 2 %
Monocytes Absolute: 0.2 10*3/uL (ref 0.1–1.0)
Neutro Abs: 9 10*3/uL — ABNORMAL HIGH (ref 1.7–7.7)
Neutrophils Relative %: 93 %
Platelets: 209 10*3/uL (ref 150–400)
RBC: 3.41 MIL/uL — ABNORMAL LOW (ref 4.22–5.81)
RDW: 15.6 % — ABNORMAL HIGH (ref 11.5–15.5)
WBC: 9.7 10*3/uL (ref 4.0–10.5)

## 2016-08-13 LAB — PROTIME-INR
INR: 2.3
Prothrombin Time: 25.7 seconds — ABNORMAL HIGH (ref 11.4–15.2)

## 2016-08-13 LAB — PHOSPHORUS: Phosphorus: 3.3 mg/dL (ref 2.5–4.6)

## 2016-08-13 LAB — MAGNESIUM: MAGNESIUM: 2 mg/dL (ref 1.7–2.4)

## 2016-08-13 MED ORDER — WARFARIN SODIUM 1 MG PO TABS
1.5000 mg | ORAL_TABLET | Freq: Once | ORAL | Status: AC
  Administered 2016-08-13: 19:00:00 1.5 mg via ORAL
  Filled 2016-08-13 (×2): qty 1

## 2016-08-13 MED ORDER — ENSURE ENLIVE PO LIQD
237.0000 mL | Freq: Two times a day (BID) | ORAL | Status: DC
  Administered 2016-08-13: 237 mL via ORAL

## 2016-08-13 NOTE — Progress Notes (Addendum)
Initial Nutrition Assessment  DOCUMENTATION CODES:   Not applicable  INTERVENTION:   Ensure Enlive po BID, each supplement provides 350 kcal and 20 grams of protein  NUTRITION DIAGNOSIS:   Increased nutrient needs related to acute illness as evidenced by increased estimated needs from protein, moderate depletions of muscle mass.  GOAL:   Patient will meet greater than or equal to 90% of their needs  MONITOR:   PO intake, Supplement acceptance, Labs, Weight trends  REASON FOR ASSESSMENT:   Rounds    ASSESSMENT:   83 y.o. male with history of CAD status post stenting, atrial fibrillation, history of PE, history of hypertension hyperlipidemia and other comorbids presents to the ER because of increasing lower abdominal pain with difficulty urinating. Found to have HCAP, AKI, urinary obstruction with retention, and cholecystitis.    Met with pt in room today. Pt reports good appetite pta and eating 60-100% meals now. Pt with wt gain since admit. Pt with moderate muscle wasting. Pt likes Ensure and is willing to drink two per day; RD will order. RD discussed with pt the importance of adequate protein intake to preserve lean muscle mass. Pt reports difficulty with swallowing sometimes and a feeling of food being stuck in his throat. RD offered to downgrade pt's diets to DYS 3 but pt likes his current diet and reports that he doesn't have any problems as long as he chews his food well.   Medications reviewed and include: Augmentin, insulin, synthroid, solu-medrol, protonix, warfarin  Labs reviewed: BUN 48(H), creat 2.42(H), alb 2.6(L), tbili 1.4(H) Hgb 9.0(L), Hct 29.2(L)  Nutrition-Focused physical exam completed. Findings are mild fat depletion in chest, moderate muscle depletion in clavicles, shoulders, and hands, and no edema.   Diet Order:  DIET SOFT Room service appropriate? Yes; Fluid consistency: Thin  Skin:  Reviewed, no issues  Last BM:  3/19  Height:   Ht Readings  from Last 1 Encounters:  08/07/16 5' 8"  (1.727 m)    Weight:   Wt Readings from Last 1 Encounters:  08/13/16 163 lb 12.8 oz (74.3 kg)    Ideal Body Weight:  70 kg  BMI:  Body mass index is 24.91 kg/m.  Estimated Nutritional Needs:   Kcal:  1750-2050kcal/day   Protein:  104-118g/day   Fluid:  >1.7g/day   EDUCATION NEEDS:   No education needs identified at this time  Koleen Distance, RD, LDN Pager #[phone]

## 2016-08-13 NOTE — Progress Notes (Signed)
PROGRESS NOTE    Troy [name]  WJX:[id number] DOB: 29-Jun-1920 DOA: 08/06/2016 PCP: Pearson Grippe, MD   Brief Narrative:  Troy [name] is a 83 y.o. male with history of CAD status post stenting, atrial fibrillation, history of PE, history of hypertension hyperlipidemia and other comorbids presents to the ER because of increasing lower abdominal pain with difficulty urinating. Denies fever chills diarrhea nausea vomiting currently but daughter stated he was vomiting PTA. Had CT of Abdomen done which showed ? Colitis so General Surgery was consulted and Foley catheter was placed for his acute Urinary Retention and patient ended up with Hematuria so Urology was consulted. INR was supratherapeutic on Admission. Patient feeling well and has no complaints currently except that he started coughing again. CXR Done and showed Left Lower Lobe/Basilar Consolidation. Patient steadily improving.  Assessment & Plan:   Principal Problem:   Abdominal pain Active Problems:   Long term current use of anticoagulant therapy   Permanent atrial fibrillation (HCC)   Hypertension   AKI (acute kidney injury) (HCC)   Urinary retention  HCAP Left Lower Lobe Consolidation -Changed Empric Zosyn to Augmentin 500 mg po BID -Patient Afebrile and No Leukocytosis -Will add Guaifenesin 1200 mg po BID, DuoNeb 3 mL RTBID, and q2hprn and Methylprednisolone 40 mg IV q12h; -Started Novolog SSI as Hyperglycemia from IV Steroids -SLP Evaluation to evaluate for Aspiration; Mild Aspiration Risk will need Regular Diet Thin Liquids -Gave another 1x dose of IV Lasix -Continue to Monitor  -Repeat CXR on 3/21 with bilateral pleural effusion, will get echocardiogram  Lower Abdominal pain with Urinary obstruction and Retention, improved -S/p Insertion of Foley and now has Hematuria -Urology Following and Appreciate Recc's -C/w Foley Catheter Drainage and Hand Irrigation as necessary -Per Urology will need to continue indwelling foley  catheter and patient should follow up with [facility] in 1 week after D/C for voiding Trial  -C/w Finasteride 5 mg po Daily and with Tamsulosin 0.8 mg Daily  Gross Hematuria, resolved  -As above -INR is now 1.71  Mild Chronic Cholecystitis -Had N/V prior to Admission -Abnormal CAT scan concerning for cholecystitis as showes wall thickening and diffuse inflammatory changes about the gallbladder suggesting acute cholecystitis- on exam patient does not have any abdominal tenderness and patient denies any nausea vomiting. -HIDA Scan ordered and showes Patent CBD without evidence of biliary ductal obstructuion and suspect chronic cholecystitis as delayed gallbladder filling was observed and waxing and waning gallbladder activity such that a valid EF could not be determined -Ultrasound of GallBladder Showed Gallstones with gallbladder wall thickening and equivocal pericholecystic fluid. These are findings indicative of a degree of cholecystitis, quite possibly chronic based on hepatobiliary imaging study appearance 1 day prior. -General Surgery Consulted and Following and appreciate Recc's -Per Dr. Eliberto Ivory note Surgery will not intervene surgically as patient refused -D/C'd IV Zosyn for Empiric Coverage and changed to Augmentin for 2 weeks total  -Patient Afebrile and has no WBC (6.9) -Per General Surgery may be discharged in next 1-2 days and follow up with Dr. Gerrit Friends as an outpatient and continue Abx at Discharge  Acute on Chronic Renal Failure with urinary retention  -Further review reveals baseline Cr 1.3-15 -Probably secondary to Urinary Retention along with some pre-renal component.  -Patient is s/p Foley catheter placement.  -Hold lisinopril due to acute renal failure. -BUN/Cr improving and went from 55/2.69 -> 43/2.25 -> 37/2.06 -> 38/2.04 -> 35/2.25 -> 37/2.25 -Repeat CMP in AM  Permanent Atrial Fbrillation  -Continue Metoprolol  50 mg po BID and Cardizem 120 mg po Daily.    -Restart Coumadin because INR was subtherapeutic (went from 4.01 -> 3.62 -> 3.99 -> 2.86 -> 1.98 -> 1.71) -C/w Telemetry  Hypertension  -C/w Metoprolol 50 mg po BID and Cardizem 120 mg po Daily.  -Holding lisinopril due to acute renal failure.  -When necessary IV hydralazine for systolic blood pressure more than 160. Patient is also on Cardizem and metoprolol.  Normocytic Anemia  -Hb/Hct went from 9.9/30.5 -> 9.3/29.6 -> 8.9/27.8 -> 9.8/31.2 -> 10.1/31.8 -Hematuria in the Foley catheter improved and Resolved.  -Closely follow CBC.  CAD status post stenting  -denies any chest pain. -C/w NTG 0.4 mg SL q39minPRN -C/w Metoprolol 50 mg po BID  Hypothyroidism -C/w Levothyroxine 50 mcg Po Daily  Hx of PE -Coumadin restarted as INR was 1.71.; Pharmacy to Dose and will give 2.5 mg po x 1 dose  Hyperglycemia in the setting of IV Steroid Use -Decease IV Steroids to 40 mg Daily -Started Moderate Novolog SSI AC -Continue to Monitor CBG's  DVT prophylaxis: SCD's; Restarted Home Coumadin Code Status: FULL CODE Family Communication: No Family at bedside Disposition: Home Health PT in 1-2 days pending on echo and bilateral pleural effusion eval  Consultants:   General Surgery  Urology   Procedures: HIDA Scan and Ultrasound of Abdomen  Antimicrobials:  Anti-infectives    Start     Dose/Rate Route Frequency Ordered Stop   08/12/16 1200  amoxicillin-clavulanate (AUGMENTIN) 500-125 MG per tablet 500 mg     1 tablet Oral 2 times daily 08/12/16 1005     08/10/16 1800  piperacillin-tazobactam (ZOSYN) IVPB 3.375 g  Status:  Discontinued     3.375 g 12.5 mL/hr over 240 Minutes Intravenous Every 8 hours 08/10/16 1051 08/12/16 1004   08/07/16 0600  piperacillin-tazobactam (ZOSYN) IVPB 2.25 g  Status:  Discontinued     2.25 g 100 mL/hr over 30 Minutes Intravenous Every 6 hours 08/07/16 0332 08/10/16 1051   08/06/16 2245  piperacillin-tazobactam (ZOSYN) IVPB 3.375 g     3.375 g 100 mL/hr  over 30 Minutes Intravenous  Once 08/06/16 2234 08/07/16 0021     Subjective: Some sob, no chest pain, no fever.  On 2liter oxygen  Objective: Vitals:   08/12/16 0916 08/12/16 1547 08/12/16 2116 08/13/16 0633  BP:  111/60 118/68 (!) 113/58  Pulse: 79 63 71 74  Resp: 16 16 18 18   Temp:  97.3 F (36.3 C) 97.4 F (36.3 C) 97.6 F (36.4 C)  TempSrc:  Oral Oral Oral  SpO2: 98% 99% 100% 93%  Weight:    74.3 kg (163 lb 12.8 oz)  Height:        Intake/Output Summary (Last 24 hours) at 08/13/16 1606 Last data filed at 08/13/16 1610  Gross per 24 hour  Intake              240 ml  Output              800 ml  Net             -560 ml   Filed Weights   08/11/16 0821 08/12/16 0620 08/13/16 9604  Weight: 73.7 kg (162 lb 7.7 oz) 73.1 kg (161 lb 2.5 oz) 74.3 kg (163 lb 12.8 oz)   Examination: Physical Exam:  Constitutional: Elderly gentleman appears younger than stated age in NAD and appears calm and comfortable Eyes: Lids and conjunctivae normal, sclerae anicteric  ENMT: External Ears, Nose appear normal.  Grossly normal hearing.  Neck: Appears normal, supple, no cervical masses, normal ROM, no appreciable thyromegaly Respiratory: Diminished to auscultation with mild crackles. Mild wheezing but no rales or rhonchi. Normal respiratory effort and patient is not tachypenic. No accessory muscle use.  Cardiovascular: Irregularly Irregular, no murmurs / rubs / gallops. S1 and S2 auscultated. No appreciable extremity edema noted. Abdomen: Soft, non-tender, non-distended. No masses palpated. No appreciable hepatosplenomegaly. Bowel sounds positive x4.  GU: Deferred. Foley draining amber urine. Musculoskeletal: No clubbing / cyanosis of digits/nails. No joint deformity upper and lower extremities.   Skin: No rashes, lesions, ulcers. Has seborrheic keratosis on face. No induration; Warm and dry.  Neurologic: CN 2-12 grossly intact with no focal deficits. Sensation intact in all 4 Extremities.  Romberg sign cerebellar reflexes not assessed.  Psychiatric: Normal judgment and insight. Alert and oriented x 3. Normal mood and appropriate affect.   Data Reviewed: I have personally reviewed following labs and imaging studies  CBC:  Recent Labs Lab 08/09/16 0534 08/10/16 0509 08/11/16 0509 08/12/16 0612 08/13/16 0606  WBC 5.3 5.8 7.4 6.9 9.7  NEUTROABS 4.1 4.6 6.1 6.4 9.0*  HGB 9.3* 8.9* 9.8* 10.1* 9.0*  HCT 29.6* 27.8* 31.2* 31.8* 29.2*  MCV 86.0 87.7 86.4 87.8 85.6  PLT 152 154 190 188 209   Basic Metabolic Panel:  Recent Labs Lab 08/09/16 0534 08/10/16 0509 08/11/16 0509 08/12/16 0612 08/13/16 0606  NA 139 142 141 140 137  K 3.5 4.4 3.8 5.0 4.1  CL 107 108 108 104 103  CO2 23 27 26 27 26   GLUCOSE 111* 123* 102* 193* 171*  BUN 37* 38* 35* 37* 48*  CREATININE 2.06* 2.04* 2.25* 2.25* 2.42*  CALCIUM 8.1* 8.4* 8.8* 9.1 8.9  MG 1.8 2.0 2.0 1.9 2.0  PHOS 2.5 2.3* 2.5 3.3 3.3   GFR: Estimated Creatinine Clearance: 18.5 mL/min (A) (by C-G formula based on SCr of 2.42 mg/dL (H)). Liver Function Tests:  Recent Labs Lab 08/09/16 0534 08/10/16 0509 08/11/16 0509 08/12/16 0612 08/13/16 0606  AST 24 23 25 30 30   ALT 26 25 23 25 26   ALKPHOS 97 96 86 90 70  BILITOT 2.1* 1.7* 1.7* 1.8* 1.4*  PROT 5.8* 6.0* 6.3* 6.9 5.9*  ALBUMIN 2.6* 2.6* 2.8* 2.9* 2.6*   No results for input(s): LIPASE, AMYLASE in the last 168 hours. No results for input(s): AMMONIA in the last 168 hours. Coagulation Profile:  Recent Labs Lab 08/09/16 0534 08/10/16 0509 08/11/16 0509 08/12/16 0612 08/13/16 0606  INR 3.99 2.86 1.98 1.71 2.30   Cardiac Enzymes: No results for input(s): CKTOTAL, CKMB, CKMBINDEX, TROPONINI in the last 168 hours. BNP (last 3 results) No results for input(s): PROBNP in the last 8760 hours. HbA1C: No results for input(s): HGBA1C in the last 72 hours. CBG:  Recent Labs Lab 08/12/16 0802 08/12/16 1542 08/12/16 2118 08/13/16 0849 08/13/16 1203  GLUCAP  176* 312* 168* 201* 165*   Lipid Profile: No results for input(s): CHOL, HDL, LDLCALC, TRIG, CHOLHDL, LDLDIRECT in the last 72 hours. Thyroid Function Tests: No results for input(s): TSH, T4TOTAL, FREET4, T3FREE, THYROIDAB in the last 72 hours. Anemia Panel: No results for input(s): VITAMINB12, FOLATE, FERRITIN, TIBC, IRON, RETICCTPCT in the last 72 hours. Sepsis Labs: No results for input(s): PROCALCITON, LATICACIDVEN in the last 168 hours.  No results found for this or any previous visit (from the past 240 hour(s)).   Radiology Studies: Dg Chest Port 1 View  Result Date: 08/13/2016 CLINICAL DATA:  Recent pneumonia  EXAM: PORTABLE CHEST 1 VIEW COMPARISON:  August 12, 2016 FINDINGS: There are bilateral pleural effusions with atelectatic change in lung bases. Opacity behind the left heart may represent a degree of airspace consolidation. There is cardiomegaly with pulmonary vascularity within normal limits. No adenopathy. There is atherosclerotic calcification in the aorta. No bone lesions. IMPRESSION: Cardiomegaly with bilateral pleural effusions and bibasilar atelectasis. Superimposed consolidation left base cannot be excluded. No new opacity. There is aortic atherosclerosis. Electronically Signed   By: Bretta BangWilliam  Woodruff III M.D.   On: 08/13/2016 08:13   Dg Chest Port 1 View  Result Date: 08/12/2016 CLINICAL DATA:  Pneumonia. EXAM: PORTABLE CHEST 1 VIEW COMPARISON:  Radiographs of August 11, 2016. FINDINGS: Stable cardiomegaly. Atherosclerosis of thoracic aorta is noted. No pneumothorax is noted. Stable bibasilar opacities are noted concerning for edema or atelectasis with probable associated pleural effusions. Bony thorax is unremarkable. IMPRESSION: Aortic atherosclerosis. Stable bibasilar edema or atelectasis is noted with probable associated pleural effusions. Electronically Signed   By: Lupita RaiderJames  Green Jr, M.D.   On: 08/12/2016 07:39   Scheduled Meds: . amoxicillin-clavulanate  1 tablet Oral  BID  . diltiazem  120 mg Oral Daily  . febuxostat  40 mg Oral Daily  . feeding supplement (ENSURE ENLIVE)  237 mL Oral BID BM  . finasteride  5 mg Oral Daily  . guaiFENesin  1,200 mg Oral BID  . insulin aspart  0-15 Units Subcutaneous TID WC  . latanoprost  1 drop Both Eyes QHS  . levothyroxine  50 mcg Oral QAC breakfast  . methylPREDNISolone (SOLU-MEDROL) injection  40 mg Intravenous Daily  . metoprolol  50 mg Oral BID  . pantoprazole  40 mg Oral Daily  . pravastatin  40 mg Oral Daily  . tamsulosin  0.8 mg Oral QHS  . warfarin  1.5 mg Oral ONCE-1800  . Warfarin - Pharmacist Dosing Inpatient   Does not apply q1800   Continuous Infusions:   LOS: 7 days   Konstance Happel, MD PhD [facility] Pager [phone]  If 7PM-7AM, please contact night-coverage [email or web address] Password [email or web address] 08/13/2016, 4:06 PM

## 2016-08-13 NOTE — Care Management Note (Signed)
Case Management Note  Patient Details  Name: Troy [name] MRN: [id number] Date of Birth: 03-29-1921  Subjective/Objective:                    Action/Plan:   Expected Discharge Date:    08/13/16              Expected Discharge Plan:  Home w Home Health Services  In-House Referral:     Discharge planning Services  CM Consult  Post Acute Care Choice:    Choice offered to:  Adult Children  DME Arranged:    DME Agency:     HH Arranged:  RN, PT, OT HH Agency:  [facility]  Status of Service:  In process, will continue to follow  If discussed at Long Length of Stay Meetings, dates discussed:    Additional CommentsGeni Bers:  Layna Roeper, RN 08/13/2016, 3:28 PM

## 2016-08-13 NOTE — Progress Notes (Signed)
Pt setup with HH with [facility]. Pt had no preference.

## 2016-08-13 NOTE — Progress Notes (Signed)
Report given by ongoing nurse. Agreed with nurse assessment of patient and will cont to monitor.  

## 2016-08-13 NOTE — Progress Notes (Signed)
ANTICOAGULATION CONSULT NOTE - Follow Up Consult  Pharmacy Consult for warfarin Indication: DVT  No Known Allergies  Patient Measurements: Height: 5\' 8"  (172.7 cm) Weight: 163 lb 12.8 oz (74.3 kg) IBW/kg (Calculated) : 68.4 Heparin Dosing Weight:   Vital Signs: Temp: 97.6 F (36.4 C) (03/21 0633) Temp Source: Oral (03/21 16100633) BP: 113/58 (03/21 96040633) Pulse Rate: 74 (03/21 0633)  Labs:  Recent Labs  08/11/16 0509 08/12/16 0612 08/13/16 0606  HGB 9.8* 10.1* 9.0*  HCT 31.2* 31.8* 29.2*  PLT 190 188 209  LABPROT 22.8* 20.3* 25.7*  INR 1.98 1.71 2.30  CREATININE 2.25* 2.25* 2.42*    Estimated Creatinine Clearance: 18.5 mL/min (A) (by C-G formula based on SCr of 2.42 mg/dL (H)).   Medications:  Scheduled:  . amoxicillin-clavulanate  1 tablet Oral BID  . diltiazem  120 mg Oral Daily  . febuxostat  40 mg Oral Daily  . finasteride  5 mg Oral Daily  . guaiFENesin  1,200 mg Oral BID  . insulin aspart  0-15 Units Subcutaneous TID WC  . latanoprost  1 drop Both Eyes QHS  . levothyroxine  50 mcg Oral QAC breakfast  . methylPREDNISolone (SOLU-MEDROL) injection  40 mg Intravenous Daily  . metoprolol  50 mg Oral BID  . pantoprazole  40 mg Oral Daily  . pravastatin  40 mg Oral Daily  . tamsulosin  0.8 mg Oral QHS  . Warfarin - Pharmacist Dosing Inpatient   Does not apply q1800    Assessment: Pharmacy is consulted to dose warfarin in 83 yo male with PMH of afib and PE and was on warfarin PTA. INR was elevated on  Admission. Warfarin was also held due to hematuria, which has now cleared.   Pt home regimen was 2.5 mg PO daily except 1.25 mg on Sunday, Tuesday, and Friday.  Today, 08/13/16  Pt hematuria has now resolved per notes  Hgb 9.0 low but stable. plt 209  INR 2.30 subtherapeutic, Pt 25.7  No bleeding issues noted   Goal of Therapy:  INR 2-3 Monitor platelets by anticoagulation protocol: Yes   Plan:   Warfarin 1.5 mg PO x1   Monitor for signs and  symptoms of bleeding  INR ordered for AM  On discharge would recommend discharging pt on warfarin 1.25 mg on Mon, Wed, Thur, Sat and 2.5 mg on Sunday, Tuesday, and Friday ( Note that this is a reversal of the days of the week that the pt took meds PTA, see above for PTA home regimen)   Adalberto ColeNikola Tyrena Gohr, PharmD, BCPS Pager [phone] 08/13/2016 12:41 PM

## 2016-08-13 NOTE — Progress Notes (Signed)
Physical Therapy Treatment Patient Details Name: Troy PiaRalph Z Waddington MRN: [id number] DOB: Oct 28, 1920 Today's Date: 08/13/2016    History of Present Illness 83 y.o. male with history of CAD status post stenting, atrial fibrillation, history of PE, history of hypertension hyperlipidemia and other comorbids presents to the ER because of increasing lower abdominal pain with difficulty urinating. Dx of AKI, urinary obstruction and retention, cholecystitis, a fib.     PT Comments    Pt progressing well with mobility, he ambulated 180' with RW, SaO2 87% on RA, 95% on RA after 1 min rest. No loss of balance with ambulation.    Follow Up Recommendations  Home health PT;Supervision for mobility/OOB      Equipment Recommendations  None recommended by PT    Recommendations for Other Services       Precautions / Restrictions Precautions Precautions: Fall Precaution Comments: h/o 1 fall in past 1 year Restrictions Weight Bearing Restrictions: No    Mobility  Bed Mobility Overal bed mobility: Modified Independent Bed Mobility: Supine to Sit     Supine to sit: Modified independent (Device/Increase time)     General bed mobility comments: used rail, HOB up 35*  Transfers Overall transfer level: Needs assistance Equipment used: Rolling walker (2 wheeled) Transfers: Sit to/from Stand Sit to Stand: Supervision         General transfer comment: good hand placement  Ambulation/Gait Ambulation/Gait assistance: Min assist Ambulation Distance (Feet): 180 Feet Assistive device: Rolling walker (2 wheeled) Gait Pattern/deviations: Step-through pattern;Trunk flexed;Decreased stride length   Gait velocity interpretation: at or above normal speed for age/gender General Gait Details: VCs for RW position and for posture;  HR 90s walking, SaO2 87% on RA walking, 95% on RA after 1 minute rest, 2/4 dyspnea, no LOB   Stairs            Wheelchair Mobility    Modified Rankin (Stroke  Patients Only)       Balance Overall balance assessment: Needs assistance;History of Falls   Sitting balance-Leahy Scale: Good       Standing balance-Leahy Scale: Fair                      Cognition Arousal/Alertness: Awake/alert Behavior During Therapy: WFL for tasks assessed/performed Overall Cognitive Status: Within Functional Limits for tasks assessed                      Exercises      General Comments        Pertinent Vitals/Pain Pain Assessment: No/denies pain    Home Living                      Prior Function            PT Goals (current goals can now be found in the care plan section) Acute Rehab PT Goals Patient Stated Goal: DC home PT Goal Formulation: With patient Time For Goal Achievement: 08/23/16 Potential to Achieve Goals: Good Progress towards PT goals: Progressing toward goals    Frequency    Min 3X/week      PT Plan Current plan remains appropriate    Co-evaluation             End of Session Equipment Utilized During Treatment: Gait belt Activity Tolerance: Patient tolerated treatment well Patient left: in chair;with call bell/phone within reach;with chair alarm set   PT Visit Diagnosis: History of falling (Z91.81);Other abnormalities of gait and mobility (R26.89)  Time: 0981-1914 PT Time Calculation (min) (ACUTE ONLY): 18 min  Charges:  $Gait Training: 8-22 mins                    G Codes:       Tamala Ser 08/13/2016, 10:42 AM [phone]

## 2016-08-14 ENCOUNTER — Inpatient Hospital Stay (HOSPITAL_COMMUNITY): Payer: Medicare Other

## 2016-08-14 DIAGNOSIS — I482 Chronic atrial fibrillation: Secondary | ICD-10-CM

## 2016-08-14 DIAGNOSIS — I5033 Acute on chronic diastolic (congestive) heart failure: Secondary | ICD-10-CM

## 2016-08-14 LAB — BASIC METABOLIC PANEL
ANION GAP: 8 (ref 5–15)
BUN: 61 mg/dL — AB (ref 6–20)
CO2: 25 mmol/L (ref 22–32)
Calcium: 8.9 mg/dL (ref 8.9–10.3)
Chloride: 103 mmol/L (ref 101–111)
Creatinine, Ser: 2.39 mg/dL — ABNORMAL HIGH (ref 0.61–1.24)
GFR calc Af Amer: 25 mL/min — ABNORMAL LOW (ref >60)
GFR, EST NON AFRICAN AMERICAN: 22 mL/min — AB (ref >60)
Glucose, Bld: 117 mg/dL — ABNORMAL HIGH (ref 65–99)
POTASSIUM: 4.1 mmol/L (ref 3.5–5.1)
SODIUM: 136 mmol/L (ref 135–145)

## 2016-08-14 LAB — ECHOCARDIOGRAM COMPLETE
HEIGHTINCHES: 68 in
Weight: 2656.1 oz

## 2016-08-14 LAB — GLUCOSE, CAPILLARY
GLUCOSE-CAPILLARY: 101 mg/dL — AB (ref 65–99)
Glucose-Capillary: 151 mg/dL — ABNORMAL HIGH (ref 65–99)
Glucose-Capillary: 247 mg/dL — ABNORMAL HIGH (ref 65–99)

## 2016-08-14 LAB — PROTIME-INR
INR: 2.22
Prothrombin Time: 25 seconds — ABNORMAL HIGH (ref 11.4–15.2)

## 2016-08-14 MED ORDER — WARFARIN SODIUM 2.5 MG PO TABS
1.2500 mg | ORAL_TABLET | ORAL | Status: DC
  Administered 2016-08-14: 1.25 mg via ORAL

## 2016-08-14 MED ORDER — TAMSULOSIN HCL 0.4 MG PO CAPS: 0.8000 mg | ORAL_CAPSULE | Freq: Every day | ORAL | 0 refills | Status: DC

## 2016-08-14 MED ORDER — FUROSEMIDE 40 MG PO TABS: 40.0000 mg | ORAL_TABLET | Freq: Every day | ORAL | 0 refills | Status: DC

## 2016-08-14 MED ORDER — FUROSEMIDE 10 MG/ML IJ SOLN
40.0000 mg | Freq: Once | INTRAMUSCULAR | Status: AC
  Administered 2016-08-14: 40 mg via INTRAVENOUS
  Filled 2016-08-14: qty 4

## 2016-08-14 MED ORDER — AMOXICILLIN-POT CLAVULANATE 500-125 MG PO TABS: 1.0000 | ORAL_TABLET | Freq: Two times a day (BID) | ORAL | 0 refills | Status: AC

## 2016-08-14 MED ORDER — WARFARIN SODIUM 2.5 MG PO TABS: 2.5000 mg | ORAL_TABLET | ORAL | Status: DC

## 2016-08-14 MED ORDER — ENSURE ENLIVE PO LIQD: 237.0000 mL | Freq: Two times a day (BID) | ORAL | 12 refills | Status: AC

## 2016-08-14 NOTE — Discharge Summary (Addendum)
Discharge Summary  Troy [name]:[id number] DOB: Dec 10, 1920  PCP: Troy Grippe, MD  Admit date: 08/06/2016 Discharge date: 08/14/2016  Time spent: >80mins, more than 50% time spent on coordination of care, patient and family counseling  Recommendations for Outpatient Follow-up:  1. F/u with PMD within a week  for hospital discharge follow up, repeat cbc/bmp at follow up 2. f/u with coumadin clinic on Monday to repeat INR 3. f/u with nephrology to monitor renal function 4. f/u with cardiology for afib/bradycardia, chf (bilateral pleural effusion) 5. f/u with urology for urinary retention 6. f/u with general surgery for chronic cholecystitis   Discharge Diagnoses:  Active Hospital Problems   Diagnosis Date Noted  . Abdominal pain 08/06/2016  . AKI (acute kidney injury) (HCC) 08/07/2016  . Urinary retention 08/07/2016  . Permanent atrial fibrillation (HCC) 03/28/2013  . Hypertension   . Long term current use of anticoagulant therapy 08/10/2012    Resolved Hospital Problems   Diagnosis Date Noted Date Resolved  No resolved problems to display.    Discharge Condition: stable  Diet recommendation: heart healthy  Filed Weights   08/12/16 0620 08/13/16 0633 08/14/16 0500  Weight: 73.1 kg (161 lb 2.5 oz) 74.3 kg (163 lb 12.8 oz) 75.3 kg (166 lb 0.1 oz)    History of present illness:  PCP: Troy Grippe, MD  Patient coming from: Home.  Chief Complaint: Abdominal pain.  HPI: Troy [name] is a 83 y.o. male with history of CAD status post stenting, atrial fibrillation, history of PE, history of hypertension hyperlipidemia and chronic steroids presents to the ER because of increasing lower abdominal pain with difficulty urinating. Denies fever chills diarrhea nausea vomiting.   ED Course: In the ER CT of the abdomen and pelvis done without contrast shows features concerning for acute cholecystitis. Patient also had urinary retention for which patient had Foley catheter Placed.  Creatinine has increased from baseline probably from obstruction. On-call general surgeon Dr. Janee Morn was consulted by ER physician. They will be seeing patient in consult. Patient is also on Coumadin for A. fib and INR is around 4.  Hospital Course:  Principal Problem:   Abdominal pain Active Problems:   Long term current use of anticoagulant therapy   Permanent atrial fibrillation (HCC)   Hypertension   AKI (acute kidney injury) (HCC)   Urinary retention  Chronic Cholecystitis -patient presented with abdominal pain, N/V, mild elevation of lft on admission -Abnormal CAT scan concerning for cholecystitis as showes wall thickening and diffuse inflammatory changes about the gallbladder suggesting acute cholecystitis- on exam patient does not have any abdominal tenderness and patient denies any nausea vomiting. -HIDA Scan ordered and showes Patent CBD without evidence of biliary ductal obstructuion and suspect chronic cholecystitis as delayed gallbladder filling was observed and waxing and waning gallbladder activity such that a valid EF could not be determined -Ultrasound of GallBladder Showed Gallstones with gallbladder wall thickening and equivocal pericholecystic fluid. These are findings indicative of a degree of cholecystitis, quite possibly chronic based on hepatobiliary imaging study appearance 1 day prior. --Per Dr. Eliberto Ivory note Surgery patient refused surgery -he was treated with IV Zosyn then changed to Augmentin for 2 weeks total  Per general surgery recommendations -Patient Afebrile and has no WBC (6.9), lft normalized, no abdominal pain, no n/v at discharge -follow up with general surgery Dr. Gerrit Friends as an outpatient and continue Abx at Discharge to finish course  Diastolic chf exacerbation/Bilateral pleural effusion+/-penumonia -he is already on abx, he does not have  cough, no chest pain, Patient Afebrile and No Leukocytosis --SLP Evaluation to evaluate for Aspiration; Mild  Aspiration Risk will need Regular Diet Thin Liquids -echocardiogram lvef 55% -60%, severely dilated left and right atrium, PA peak pressure .  -he received IV Lasix, home lasix resumed at daily (instead of every other day) - he ambulated , o2 sat remain above 90 on room air. -he is discharged on oral lasix and follow up with cardiology   Lower Abdominal pain with Urinary  Retention, improved -S/p Insertion of Foley  --Per Urology will need to continue indwelling foley catheter and patient should follow up with [facility] in 1 week after D/C for voiding Trial  -C/w Finasteride 5 mg po Daily and with Tamsulosin 0.8 mg Daily  Gross Hematuria, resolved  -post foley placement, in the setting of supratherapeutic INR on admission     Acute on Chronic Renal Failure IV with urinary retention  -Further review reveals baseline Cr 1.3-15 in 2012, no interval cr level in epic -cr 3.12 on admission, cr 2.39 at discharge  -Patient is s/p Foley catheter placement.  -Hold lisinopril due to acute renal failure. -he is to follow with urology and nephrology  Permanent Atrial Fbrillation , intermittent bradycardia at night and occasionally 2sec pauses on tele -Continue Metoprolol 50 mg po BID , d/c  Cardizem 120 mg po Daily.  -he presented with supratherapeutic INR 4  -coumadin dose adjusted, INR at discharge-2.30-2.22 -he is to follow up with cardiology, and coumadin clinic  CAD status post stenting  -denies any chest pain. -C/w NTG 0.4 mg SL q91minPRN -C/w Metoprolol 50 mg po BID, statin  Hypertension  -C/w Metoprolol 50 mg po BID  -hold cardizem due to bradycardia -Holding lisinopril due to acute renal failure.    Normocytic Anemia  -hgb stable at 9-10 during hospitalization -Hematuria in the Foley catheter initially, Resolved.    Hypothyroidism -C/w Levothyroxine 50 mcg Po Daily  Hx of PE -Coumadin restarted as INR was 1.71.; Pharmacy to Dose and will give 2.5 mg  po x 1 dose  Patient is on chronic prednisone 5mg  daily for years for chronic arthritic pain, this is continued at discharge.   DVT prophylaxis: SCD's; Restarted Home Coumadin Code Status: FULL CODE Family Communication: patient and daughter at bedside Disposition: Home Health PT on 3/22  Consultants:   General Surgery  Urology   Procedures: HIDA Scan and Ultrasound of Abdomen  Antimicrobials:            Anti-infectives    Start     Dose/Rate Route Frequency Ordered Stop   08/12/16 1200  amoxicillin-clavulanate (AUGMENTIN) 500-125 MG per tablet 500 mg     1 tablet Oral 2 times daily 08/12/16 1005     08/10/16 1800  piperacillin-tazobactam (ZOSYN) IVPB 3.375 g  Status:  Discontinued     3.375 g 12.5 mL/hr over 240 Minutes Intravenous Every 8 hours 08/10/16 1051 08/12/16 1004   08/07/16 0600  piperacillin-tazobactam (ZOSYN) IVPB 2.25 g  Status:  Discontinued     2.25 g 100 mL/hr over 30 Minutes Intravenous Every 6 hours 08/07/16 0332 08/10/16 1051   08/06/16 2245  piperacillin-tazobactam (ZOSYN) IVPB 3.375 g     3.375 g 100 mL/hr over 30 Minutes Intravenous       Discharge Exam: BP 121/62 (BP Location: Left Arm)   Pulse 73   Temp 97.3 F (36.3 C) (Axillary)   Resp 18   Ht 5\' 8"  (1.727 m)   Wt 75.3 kg (  166 lb 0.1 oz)   SpO2 97%   BMI 25.24 kg/m   General: NAD Cardiovascular: IRRR Respiratory: dimiinished at basis, no rales, no rhonchi, no wheezing Abdominal: soft/NT/ND, + BS. Extremity : no adema  Discharge Instructions You were cared for by a hospitalist during your hospital stay. If you have any questions about your discharge medications or the care you received while you were in the hospital after you are discharged, you can call the unit and asked to speak with the hospitalist on call if the hospitalist that took care of you is not available. Once you are discharged, your primary care physician will handle any further medical issues. Please note  that NO REFILLS for any discharge medications will be authorized once you are discharged, as it is imperative that you return to your primary care physician (or establish a relationship with a primary care physician if you do not have one) for your aftercare needs so that they can reassess your need for medications and monitor your lab values.  Discharge Instructions    Diet - low sodium heart healthy    Complete by:  As directed    Face-to-face encounter (required for Medicare/Medicaid patients)    Complete by:  As directed    I Troy [name] certify that this patient is under my care and that I, or a nurse practitioner or physician's assistant working with me, had a face-to-face encounter that meets the physician face-to-face encounter requirements with this patient on 08/14/2016. The encounter with the patient was in whole, or in part for the following medical condition(s) which is the primary reason for home health care (List medical condition): FTT   The encounter with the patient was in whole, or in part, for the following medical condition, which is the primary reason for home health care:  FTT   I certify that, based on my findings, the following services are medically necessary home health services:   Nursing Physical therapy     Reason for Medically Necessary Home Health Services:  Skilled Nursing- Change/Decline in Patient Status   My clinical findings support the need for the above services:  Shortness of breath with activity   Further, I certify that my clinical findings support that this patient is homebound due to:  Shortness of Breath with activity   Home Health    Complete by:  As directed    To provide the following care/treatments:   PT OT RN     Increase activity slowly    Complete by:  As directed      Allergies as of 08/14/2016   No Known Allergies     Medication List    STOP taking these medications   diltiazem 120 MG 24 hr capsule Commonly known as:  CARDIZEM CD     lisinopril 20 MG tablet Commonly known as:  PRINIVIL,ZESTRIL     TAKE these medications   amoxicillin-clavulanate 500-125 MG tablet Commonly known as:  AUGMENTIN Take 1 tablet (500 mg total) by mouth 2 (two) times daily.   aspirin 81 MG EC tablet Take 81 mg by mouth daily.   feeding supplement (ENSURE ENLIVE) Liqd Take 237 mLs by mouth 2 (two) times daily between meals. Start taking on:  08/15/2016   finasteride 5 MG tablet Commonly known as:  PROSCAR Take 5 mg by mouth daily.   furosemide 40 MG tablet Commonly known as:  LASIX Take 1 tablet (40 mg total) by mouth daily. PATIENT NEEDS TO CONTACT OFFICE FOR ADDITIONAL REFILLS  What changed:  additional instructions   levothyroxine 50 MCG tablet Commonly known as:  SYNTHROID, LEVOTHROID Take 50 mcg by mouth daily.   metoprolol 50 MG tablet Commonly known as:  LOPRESSOR Take 1 tablet (50 mg total) by mouth 2 (two) times daily.   nitroGLYCERIN 0.4 MG SL tablet Commonly known as:  NITROSTAT Place 0.4 mg under the tongue every 5 (five) minutes as needed.   pantoprazole 40 MG tablet Commonly known as:  PROTONIX Take 1 tablet (40 mg total) by mouth daily.   pravastatin 40 MG tablet Commonly known as:  PRAVACHOL Take 40 mg by mouth daily.   predniSONE 5 MG tablet Commonly known as:  DELTASONE Take 5 mg by mouth daily.   tamsulosin 0.4 MG Caps capsule Commonly known as:  FLOMAX Take 2 capsules (0.8 mg total) by mouth at bedtime. What changed:  how much to take   ULORIC 40 MG tablet Generic drug:  febuxostat TAKE 1 TABLET BY MOUTH EVERY DAY   warfarin 2.5 MG tablet Commonly known as:  COUMADIN Take 1.25-2.5 mg by mouth daily. TAKES 1.25 Sunday Tuesday and Friday night,and 2.5 mg the rest of the week.      No Known Allergies Follow-up Information    GERKIN,TODD M, MD. Schedule an appointment as soon as possible for a visit in 3 week(s).   Specialty:  General Surgery Contact information: [address] [place] [address] [phone]        Call [facility]   Why:  For an appointment in ~1 week when you get home to get your catheter out. Contact information: [address] [place] [address] [phone]        Nanetta Batty, MD Follow up in 3 week(s).   Specialties:  Cardiology, Radiology Why:  for afib /intermittent bradycardia bilateral pleural effusion/chf Contact information: [address] [place] [address] [phone]        follow up with coumadin clinic on monday Follow up.        Irena Cords, MD Follow up in 3 week(s).   Specialty:  Nephrology Why:  monitor renal function Contact information: [address] [place] [address] [phone]        Troy Grippe, MD Follow up in 1 week(s).   Specialty:  Internal Medicine Why:  hospital discharge follow up, repeat cbc/bmp at follow up Contact information: [address] [place] [address] [phone]            The results of significant diagnostics from this hospitalization (including imaging, microbiology, ancillary and laboratory) are listed below for reference.    Significant Diagnostic Studies: Ct Abdomen Pelvis Wo Contrast  Result Date: 08/06/2016 CLINICAL DATA:  Abdominal pain and bloating. Decreased urine output back. EXAM: CT ABDOMEN AND PELVIS WITHOUT CONTRAST TECHNIQUE: Multidetector CT imaging of the abdomen and pelvis was performed following the standard protocol without IV contrast. COMPARISON:  Renal ultrasound 10/18/2014. FINDINGS: Lower chest: A small right pleural effusion is present. Right greater than left lower lobe airspace disease is present. The heart is enlarged. Coronary artery calcifications are present. No significant pericardial effusion is present. Hepatobiliary: The liver is somewhat irregular suggesting cirrhosis. No discrete lesions are present. The common bile duct is within normal  limits. There is wall thickening and inflammatory change about the gallbladder suggesting acute cholecystitis. There is noted free air or fluid. Pancreas: Within normal limits. Spleen: Normal in size without  focal abnormality. Adrenals/Urinary Tract: The adrenal glands are normal bilaterally. Bilateral renal cystic disease is again noted. There is no stone or mass lesion. The central sinus cyst is present on the right. The ureters are within normal limits bilaterally. A Foley catheter is present within the urinary bladder. Stomach/Bowel: The stomach is within normal limits. The duodenum is unremarkable. Small bowel is within normal limits. Inflammatory changes are present about the ascending colon the appendix is visualized and normal. The transverse colon is unremarkable. The descending colon demonstrates diverticula without focal inflammation to suggest diverticulitis. Additional diverticular present throughout the sigmoid colon without focal inflammation. Vascular/Lymphatic: Atherosclerotic calcifications are present in the aorta and branch vessels without aneurysm. No significant adenopathy is present. Reproductive: Calcifications are present within the prostate gland. The prostate gland is markedly enlarged at 7.1 cm in transverse diameter. Other: No free air free fluid is present. Musculoskeletal: Multilevel facet degenerative changes are present. Slight degenerate anterolisthesis is present at L4-5. Leftward curvature of the lumbar spine is centered at L1. There is rightward curvature at L5. Pelvis is intact. The hips are located. IMPRESSION: 1. Wall thickening and diffuse inflammatory changes about the gallbladder suggesting acute cholecystitis. 2. Inflammatory changes about the ascending colon likely represent secondary reactive change without focal colitis. 3. Sigmoid diverticulosis without diverticulitis. 4. Extensive atherosclerotic change without aneurysm. 5. Marked dilation of the prostate gland. 6.  Cardiomegaly. 7. Small right pleural effusion. 8. Mild irregularity of the liver suggesting cirrhosis. 9. Bilateral renal cystic disease. Electronically Signed   By: Marin Roberts M.D.   On: 08/06/2016 20:25   Nm Hepato W/eject Fract  Result Date: 08/07/2016 CLINICAL DATA:  83 year old male with circumferential gallbladder wall thickening on recent CT Abdomen and Pelvis. Elevated bilirubin. Abdominal pain. EXAM: NUCLEAR MEDICINE HEPATOBILIARY IMAGING WITH GALLBLADDER EF TECHNIQUE: Sequential images of the abdomen were obtained out to 60 minutes following intravenous administration of radiopharmaceutical. After slow intravenous infusion of 1.4 micrograms Cholecystokinin, gallbladder ejection fraction was determined. RADIOPHARMACEUTICALS:  7.8 mCi Tc-68m Choletec IV COMPARISON:  CT Abdomen and Pelvis 08/06/2016 FINDINGS: Prompt radiotracer uptake by the liver. Mildly slow clearance from the blood pool. CBD and small bowel activity visible by 40 minutes. Probable faint gallbladder activity on the 30 minutes image, but was not definitive. After an additional 30 minutes, and beginning at 70 minutes, gallbladder activity was confidently identified along the inferior margin of the right liver. Small bowel activity increased over the course of the study. Following CCK administration the gallbladder activity did wax and wane indicating some gallbladder emptying, but despite multiple attempts a valid ejection fraction could not be determined. The patient was not symptomatic during or after CCK infusion. IMPRESSION: 1. Patent CBD without evidence of biliary ductal obstruction. 2. Suspect Chronic Cholecystitis as delayed gallbladder filling was observed. Waxing and waning gallbladder activity such that a valid ejection fraction could not be determined. Electronically Signed   By: Odessa Fleming M.D.   On: 08/07/2016 15:09   Dg Chest Port 1 View  Result Date: 08/13/2016 CLINICAL DATA:  Recent pneumonia EXAM: PORTABLE  CHEST 1 VIEW COMPARISON:  August 12, 2016 FINDINGS: There are bilateral pleural effusions with atelectatic change in lung bases. Opacity behind the left heart may represent a degree of airspace consolidation. There is cardiomegaly with pulmonary vascularity within normal limits. No adenopathy. There is atherosclerotic calcification in the aorta. No bone lesions. IMPRESSION: Cardiomegaly with bilateral pleural effusions and bibasilar atelectasis. Superimposed consolidation left base cannot be excluded. No new opacity. There is  aortic atherosclerosis. Electronically Signed   By: Bretta Bang III M.D.   On: 08/13/2016 08:13   Dg Chest Port 1 View  Result Date: 08/12/2016 CLINICAL DATA:  Pneumonia. EXAM: PORTABLE CHEST 1 VIEW COMPARISON:  Radiographs of August 11, 2016. FINDINGS: Stable cardiomegaly. Atherosclerosis of thoracic aorta is noted. No pneumothorax is noted. Stable bibasilar opacities are noted concerning for edema or atelectasis with probable associated pleural effusions. Bony thorax is unremarkable. IMPRESSION: Aortic atherosclerosis. Stable bibasilar edema or atelectasis is noted with probable associated pleural effusions. Electronically Signed   By: Lupita Raider, M.D.   On: 08/12/2016 07:39   Dg Chest Port 1 View  Result Date: 08/11/2016 CLINICAL DATA:  Cough EXAM: PORTABLE CHEST 1 VIEW COMPARISON:  Chest radiograph 08/10/2016 FINDINGS: Unchanged enlarged cardiomediastinal silhouette with calcific aortic atherosclerosis. There is consolidation of the retrocardiac left lung base, unchanged. There is atelectasis of the right lung base. No pneumothorax. IMPRESSION: Unchanged examination with cardiomegaly, aortic atherosclerosis and left basilar consolidation. Electronically Signed   By: Deatra Robinson M.D.   On: 08/11/2016 05:32   Dg Chest Port 1 View  Result Date: 08/10/2016 CLINICAL DATA:  Shortness of breath beginning today. EXAM: PORTABLE CHEST 1 VIEW COMPARISON:  01/05/2011 FINDINGS:  Chronically enlarged cardiac silhouette that could be due to cardiomegaly and/or pericardial fluid. Aortic atherosclerosis. Bilateral effusions with atelectasis and or infiltrate in both lower lobes. Venous hypertension with mild interstitial edema. IMPRESSION: Congestive heart failure. Enlarged cardiac silhouette. Venous hypertension with mild interstitial edema. Bilateral effusions with lower lobe atelectasis and/or pneumonia. Electronically Signed   By: Paulina Fusi M.D.   On: 08/10/2016 12:30   US Abdomen Limited Ruq  Result Date: 08/08/2016 CLINICAL DATA:  Right upper quadrant pain EXAM: US ABDOMEN LIMITED - RIGHT UPPER QUADRANT COMPARISON:  Nuclear medicine hepatobiliary imaging study August 07, 2016 and CT abdomen and pelvis August 06, 2016 FINDINGS: Gallbladder: Within the gallbladder, there are echogenic foci which move and shadow consistent with gallstones. Largest gallstone measures 6 mm. Gallbladder wall appears somewhat thickened with equivocal pericholecystic fluid. No sonographic Murphy sign noted by sonographer. Common bile duct: Diameter: 5 mm. No intrahepatic or extrahepatic biliary duct dilatation. Liver: No focal lesion identified. Liver echogenicity is within normal limits. IMPRESSION: Gallstones with gallbladder wall thickening and equivocal pericholecystic fluid. These are findings indicative of a degree of cholecystitis, quite possibly chronic based on hepatobiliary imaging study appearance 1 day prior. Electronically Signed   By: Bretta Bang III M.D.   On: 08/08/2016 11:51    Microbiology: No results found for this or any previous visit (from the past 240 hour(s)).   Labs: Basic Metabolic Panel:  Recent Labs Lab 08/09/16 0534 08/10/16 0509 08/11/16 0509 08/12/16 0612 08/13/16 0606 08/14/16 0542  NA 139 142 141 140 137 136  K 3.5 4.4 3.8 5.0 4.1 4.1  CL 107 108 108 104 103 103  CO2 23 27 26 27 26 25   GLUCOSE 111* 123* 102* 193* 171* 117*  BUN 37* 38* 35* 37* 48*  61*  CREATININE 2.06* 2.04* 2.25* 2.25* 2.42* 2.39*  CALCIUM 8.1* 8.4* 8.8* 9.1 8.9 8.9  MG 1.8 2.0 2.0 1.9 2.0  --   PHOS 2.5 2.3* 2.5 3.3 3.3  --    Liver Function Tests:  Recent Labs Lab 08/09/16 0534 08/10/16 0509 08/11/16 0509 08/12/16 0612 08/13/16 0606  AST 24 23 25 30 30   ALT 26 25 23 25 26   ALKPHOS 97 96 86 90 70  BILITOT 2.1* 1.7*  1.7* 1.8* 1.4*  PROT 5.8* 6.0* 6.3* 6.9 5.9*  ALBUMIN 2.6* 2.6* 2.8* 2.9* 2.6*   No results for input(s): LIPASE, AMYLASE in the last 168 hours. No results for input(s): AMMONIA in the last 168 hours. CBC:  Recent Labs Lab 08/09/16 0534 08/10/16 0509 08/11/16 0509 08/12/16 0612 08/13/16 0606  WBC 5.3 5.8 7.4 6.9 9.7  NEUTROABS 4.1 4.6 6.1 6.4 9.0*  HGB 9.3* 8.9* 9.8* 10.1* 9.0*  HCT 29.6* 27.8* 31.2* 31.8* 29.2*  MCV 86.0 87.7 86.4 87.8 85.6  PLT 152 154 190 188 209   Cardiac Enzymes: No results for input(s): CKTOTAL, CKMB, CKMBINDEX, TROPONINI in the last 168 hours. BNP: BNP (last 3 results) No results for input(s): BNP in the last 8760 hours.  ProBNP (last 3 results) No results for input(s): PROBNP in the last 8760 hours.  CBG:  Recent Labs Lab 08/13/16 1203 08/13/16 1650 08/13/16 2136 08/14/16 0738 08/14/16 1147  GLUCAP 165* 161* 136* 101* 151*       Signed:  Ashly Yepez MD, PhD  [facility] 08/14/2016, 4:57 PM

## 2016-08-14 NOTE — Progress Notes (Signed)
Physical Therapy Treatment Patient Details Name: Troy PiaRalph Z [name] MRN: [id number] DOB: 1920/05/29 Today's Date: 08/14/2016    History of Present Illness 83 y.o. male with history of CAD status post stenting, atrial fibrillation, history of PE, history of hypertension hyperlipidemia and other comorbids presents to the ER because of increasing lower abdominal pain with difficulty urinating. Dx of AKI, urinary obstruction and retention, cholecystitis, a fib.     PT Comments    Pt progressing well with mobility, he ambulated 240' with RW, no loss of balance, 2/4 dyspnea,  SaO2 97% on RA walking.   Follow Up Recommendations  Home health PT;Supervision for mobility/OOB      Equipment Recommendations  None recommended by PT    Recommendations for Other Services       Precautions / Restrictions Precautions Precautions: Fall Precaution Comments: h/o 1 fall in past 1 year Restrictions Weight Bearing Restrictions: No    Mobility  Bed Mobility Overal bed mobility: Modified Independent Bed Mobility: Supine to Sit     Supine to sit: Modified independent (Device/Increase time)     General bed mobility comments: used rail, HOB up 35*  Transfers Overall transfer level: Needs assistance Equipment used: Rolling walker (2 wheeled) Transfers: Sit to/from Stand Sit to Stand: Supervision         General transfer comment: good hand placement, supervision for safety  Ambulation/Gait Ambulation/Gait assistance: Supervision Ambulation Distance (Feet): 240 Feet Assistive device: Rolling walker (2 wheeled) Gait Pattern/deviations: Step-through pattern;Trunk flexed;Decreased stride length   Gait velocity interpretation: at or above normal speed for age/gender General Gait Details: VCs for RW position and for posture;  SaO2 97% on RA walking,  2/4 dyspnea, no LOB; distance limited by fatigue   Stairs            Wheelchair Mobility    Modified Rankin (Stroke Patients Only)        Balance Overall balance assessment: Needs assistance;History of Falls   Sitting balance-Leahy Scale: Good       Standing balance-Leahy Scale: Fair                      Cognition Arousal/Alertness: Awake/alert Behavior During Therapy: WFL for tasks assessed/performed Overall Cognitive Status: Within Functional Limits for tasks assessed                      Exercises      General Comments        Pertinent Vitals/Pain Pain Assessment: No/denies pain    Home Living                      Prior Function            PT Goals (current goals can now be found in the care plan section) Acute Rehab PT Goals Patient Stated Goal: DC home PT Goal Formulation: With patient Time For Goal Achievement: 08/23/16 Potential to Achieve Goals: Good    Frequency    Min 3X/week      PT Plan Current plan remains appropriate    Co-evaluation             End of Session Equipment Utilized During Treatment: Gait belt Activity Tolerance: Patient tolerated treatment well Patient left: in chair;with call bell/phone within reach;with chair alarm set   PT Visit Diagnosis: History of falling (Z91.81);Other abnormalities of gait and mobility (R26.89)     Time: [id number] PT Time Calculation (min) (ACUTE ONLY): 15 min  Charges:  $  Gait Training: 8-22 mins                    G Codes:       Tamala Ser 08/14/2016, 2:42 PM [phone]

## 2016-08-14 NOTE — Progress Notes (Signed)
Pt walked by physical therapy.  O2 sustaining in the 90's.

## 2016-08-14 NOTE — Progress Notes (Signed)
Went over all discharge paperwork.  All questions answered.  To discharge home with foley per MD order and follow up with urology.  Foley teaching done with patient and daughter with teach back.  All paperwork given to daughter.  VSS.  Pt wheeled out with wheelchair.

## 2016-08-14 NOTE — Progress Notes (Signed)
  Echocardiogram 2D Echocardiogram has been performed.  Arvil ChacoFoster, Gaylia Kassel 08/14/2016, 1:14 PM

## 2016-08-14 NOTE — Progress Notes (Addendum)
ANTICOAGULATION CONSULT NOTE - Follow Up Consult  Pharmacy Consult for warfarin Indication: DVT  No Known Allergies  Patient Measurements: Height: 5\' 8"  (172.7 cm) Weight: 166 lb 0.1 oz (75.3 kg) IBW/kg (Calculated) : 68.4 Heparin Dosing Weight:   Vital Signs: Temp: 97.4 F (36.3 C) (03/22 0444) Temp Source: Oral (03/22 0444) BP: 125/76 (03/22 0444) Pulse Rate: 65 (03/22 0444)  Labs:  Recent Labs  08/12/16 0612 08/13/16 0606 08/14/16 0542  HGB 10.1* 9.0*  --   HCT 31.8* 29.2*  --   PLT 188 209  --   LABPROT 20.3* 25.7* 25.0*  INR 1.71 2.30 2.22  CREATININE 2.25* 2.42* 2.39*    Estimated Creatinine Clearance: 18.7 mL/min (A) (by C-G formula based on SCr of 2.39 mg/dL (H)).  Assessment: Pharmacy is consulted to dose warfarin in 83 yo male with PMH of afib and PE and was on warfarin PTA. INR was elevated on  Admission. Warfarin was also held due to hematuria, which has now cleared.   Pt home regimen was 2.5 mg PO daily except 1.25 mg on Sunday, Tuesday, and Friday.  Today, 08/14/2016:  INR 2.22 therapeutic  No bleeding issues noted   Goal of Therapy:  INR 2-3 Monitor platelets by anticoagulation protocol: Yes    Plan: Monitor for signs and symptoms of bleeding  Start 1.25 mg daily on Mon/Wed/Thur/Sat and  2. 5 mg on Sun/Tues/Friday  On discharge would recommend discharging pt on warfarin 1.25 mg on Mon, Wed, Thur, Sat and 2.5 mg on Sunday, Tuesday, and Friday ( Note that this is a reversal of the days of the week that the pt took meds PTA, see above for PTA home regimen)   Herby AbrahamMichelle T. Kerie Badger, Pharm.D. [phone] 08/14/2016 7:55 AM

## 2016-08-14 NOTE — Progress Notes (Addendum)
[facility].  Charge nurse made aware that HH needs to be set up and will passed on to case manager in the AM.  Pt daughter made aware as well.

## 2016-08-15 ENCOUNTER — Other Ambulatory Visit: Payer: Self-pay | Admitting: Cardiovascular Disease

## 2016-08-15 ENCOUNTER — Other Ambulatory Visit: Payer: Self-pay | Admitting: *Deleted

## 2016-08-15 MED ORDER — METOPROLOL TARTRATE 50 MG PO TABS: 50.0000 mg | ORAL_TABLET | Freq: Two times a day (BID) | ORAL | 3 refills | Status: DC

## 2016-08-15 NOTE — Telephone Encounter (Signed)
E SENT TO PHARMACY 

## 2016-08-28 ENCOUNTER — Other Ambulatory Visit: Payer: Self-pay | Admitting: Cardiovascular Disease

## 2016-08-28 NOTE — Telephone Encounter (Signed)
REFILL 

## 2016-09-12 ENCOUNTER — Encounter: Payer: Self-pay | Admitting: Cardiovascular Disease

## 2016-09-12 ENCOUNTER — Ambulatory Visit (INDEPENDENT_AMBULATORY_CARE_PROVIDER_SITE_OTHER): Payer: Medicare Other | Admitting: Cardiovascular Disease

## 2016-09-12 DIAGNOSIS — I10 Essential (primary) hypertension: Secondary | ICD-10-CM | POA: Diagnosis not present

## 2016-09-12 DIAGNOSIS — I4821 Permanent atrial fibrillation: Secondary | ICD-10-CM

## 2016-09-12 DIAGNOSIS — I482 Chronic atrial fibrillation: Secondary | ICD-10-CM | POA: Diagnosis not present

## 2016-09-12 NOTE — Progress Notes (Signed)
09/12/2016 Troy [name]   May 16, 1921  [id number]  Primary Physician Pearson Grippe, MD Primary Cardiologist: Runell Gess MD Troy [name]  HPI:  The patient is an 83 year old, mildly overweight, married Caucasian male, father of 2, grandfather to 2 grandchildren who is accompanied by his daughter Troy [name] today... Unfortunately, his son apparently was found dead by the patient May 2015thought to be related to an acute myocardial infarction. I last saw him in the office 08/01/16.Marland Kitchen He has a history of CAD and has had a non-ST-segment-elevation myocardial infarction in the past. He had staged LAD and circumflex intervention by Dr. Tresa Endo in January of last year with Promus drug-eluting stent to his proximal LAD. He also has a history of pulmonary emboli and chronic A-fib on Coumadin anticoagulation. His other problems include hypertension and hyperlipidemia. Last echo performed a year ago revealed normal LV systolic function with diastolic dysfunction and mild posterior wall and inferior wall hypokinesia. He did have mild to moderate pulmonary hypertension with moderate TR. A Myoview performed at the same time did show inferolateral scar. Not only did he lose his son a year ago from an acute myocardial infarction but he also lost his wife of 65 years 04/06/14. He currently lives alone. Since I saw him in the office he denies chest pain or shortness of breath. He did have a mechanical fall from loss of balance recently but this has been a rare phenomenon. He was hospitalized after his last office visit for pneumonia and was therefore 8 days. Performed 08/14/16 revealed normal LV systolic function with mild to moderate pulmonary hypertension. He currently denies chest pain or shortness of breath. His diltiazem and lisinopril were discontinued.   Current Outpatient Prescriptions  Medication Sig Dispense Refill  . aspirin 81 MG EC tablet Take 81 mg by mouth daily.      . feeding supplement, ENSURE  ENLIVE, (ENSURE ENLIVE) LIQD Take 237 mLs by mouth 2 (two) times daily between meals. 237 mL 12  . finasteride (PROSCAR) 5 MG tablet Take 5 mg by mouth daily.     . furosemide (LASIX) 40 MG tablet Take 1 tablet (40 mg total) by mouth daily. PATIENT NEEDS TO CONTACT OFFICE FOR ADDITIONAL REFILLS 90 tablet 0  . levothyroxine (SYNTHROID, LEVOTHROID) 50 MCG tablet Take 50 mcg by mouth daily.  4  . metoprolol (LOPRESSOR) 50 MG tablet Take 1 tablet (50 mg total) by mouth 2 (two) times daily. 180 tablet 3  . nitroGLYCERIN (NITROSTAT) 0.4 MG SL tablet Place 0.4 mg under the tongue every 5 (five) minutes as needed.    . pantoprazole (PROTONIX) 40 MG tablet TAKE 1 TABLET (40 MG TOTAL) BY MOUTH DAILY. 30 tablet 11  . pravastatin (PRAVACHOL) 40 MG tablet Take 40 mg by mouth daily.    . predniSONE (DELTASONE) 5 MG tablet Take 5 mg by mouth daily.      . tamsulosin (FLOMAX) 0.4 MG CAPS capsule Take 2 capsules (0.8 mg total) by mouth at bedtime. 30 capsule 0  . ULORIC 40 MG tablet TAKE 1 TABLET BY MOUTH EVERY DAY 30 tablet 11  . warfarin (COUMADIN) 2.5 MG tablet Take 1.25-2.5 mg by mouth daily. TAKES 1.25 Sunday Tuesday and Friday night,and 2.5 mg the rest of the week.     No current facility-administered medications for this visit.     No Known Allergies  Social History   Social History  . Marital status: Married    Spouse name: N/A  .  Number of children: 2  . Years of education: N/A   Occupational History  .  Retired   Social History Main Topics  . Smoking status: Former Smoker    Quit date: 09/27/1973  . Smokeless tobacco: Never Used  . Alcohol use No  . Drug use: No  . Sexual activity: Not on file   Other Topics Concern  . Not on file   Social History Narrative  . No narrative on file     Review of Systems: General: negative for chills, fever, night sweats or weight changes.  Cardiovascular: negative for chest pain, dyspnea on exertion, edema, orthopnea, palpitations, paroxysmal  nocturnal dyspnea or shortness of breath Dermatological: negative for rash Respiratory: negative for cough or wheezing Urologic: negative for hematuria Abdominal: negative for nausea, vomiting, diarrhea, bright red blood per rectum, melena, or hematemesis Neurologic: negative for visual changes, syncope, or dizziness All other systems reviewed and are otherwise negative except as noted above.    Blood pressure 106/64, pulse 75, height  (1.702 m), weight 145 lb (65.8 kg).  General appearance: alert and no distress Neck: no adenopathy, no carotid bruit, no JVD, supple, symmetrical, trachea midline and thyroid not enlarged, symmetric, no tenderness/mass/nodules Lungs: clear to auscultation bilaterally Heart: irregularly irregular rhythm Extremities: extremities normal, atraumatic, no cyanosis or edema  EKG atrial fibrillation with a ventricular response of 75 and occasional aberrant beats. I personally reviewed this EKG.  ASSESSMENT AND PLAN:   Permanent atrial fibrillation (HCC) History of permanent atrial fibrillation rate controlled on Coumadin anticoagulation.  Hypertension History of essential hypertension on metoprolol. His lisinopril and diltiazem were discontinued during his recent hospitalization. His blood pressure today is 106/64.      Runell Gess MD FACP,[name],[name], [facility] 09/12/2016 2:19 PM

## 2016-09-12 NOTE — Assessment & Plan Note (Signed)
History of permanent atrial fibrillation rate controlled on Coumadin anticoagulation. 

## 2016-09-12 NOTE — Patient Instructions (Signed)

## 2016-09-12 NOTE — Assessment & Plan Note (Signed)
History of essential hypertension on metoprolol. His lisinopril and diltiazem were discontinued during his recent hospitalization. His blood pressure today is 106/64.

## 2016-11-07 ENCOUNTER — Other Ambulatory Visit (HOSPITAL_COMMUNITY): Payer: Self-pay | Admitting: *Deleted

## 2016-11-10 ENCOUNTER — Encounter (HOSPITAL_COMMUNITY): Payer: Medicare Other

## 2017-02-27 DIAGNOSIS — D539 Nutritional anemia, unspecified: Secondary | ICD-10-CM | POA: Diagnosis present

## 2017-02-27 DIAGNOSIS — Z955 Presence of coronary angioplasty implant and graft: Secondary | ICD-10-CM

## 2017-02-27 DIAGNOSIS — Z7982 Long term (current) use of aspirin: Secondary | ICD-10-CM

## 2017-02-27 DIAGNOSIS — N184 Chronic kidney disease, stage 4 (severe): Secondary | ICD-10-CM | POA: Diagnosis present

## 2017-02-27 DIAGNOSIS — I2510 Atherosclerotic heart disease of native coronary artery without angina pectoris: Secondary | ICD-10-CM | POA: Diagnosis present

## 2017-02-27 DIAGNOSIS — I252 Old myocardial infarction: Secondary | ICD-10-CM

## 2017-02-27 DIAGNOSIS — N390 Urinary tract infection, site not specified: Secondary | ICD-10-CM | POA: Diagnosis present

## 2017-02-27 DIAGNOSIS — L03113 Cellulitis of right upper limb: Principal | ICD-10-CM | POA: Diagnosis present

## 2017-02-27 DIAGNOSIS — Z79899 Other long term (current) drug therapy: Secondary | ICD-10-CM

## 2017-02-27 DIAGNOSIS — I272 Pulmonary hypertension, unspecified: Secondary | ICD-10-CM | POA: Diagnosis present

## 2017-02-27 DIAGNOSIS — I480 Paroxysmal atrial fibrillation: Secondary | ICD-10-CM | POA: Diagnosis present

## 2017-02-27 DIAGNOSIS — Z87891 Personal history of nicotine dependence: Secondary | ICD-10-CM

## 2017-02-27 DIAGNOSIS — D509 Iron deficiency anemia, unspecified: Secondary | ICD-10-CM | POA: Diagnosis present

## 2017-02-27 DIAGNOSIS — Z86711 Personal history of pulmonary embolism: Secondary | ICD-10-CM

## 2017-02-27 DIAGNOSIS — Z66 Do not resuscitate: Secondary | ICD-10-CM | POA: Diagnosis present

## 2017-02-27 DIAGNOSIS — I129 Hypertensive chronic kidney disease with stage 1 through stage 4 chronic kidney disease, or unspecified chronic kidney disease: Secondary | ICD-10-CM | POA: Diagnosis present

## 2017-02-27 DIAGNOSIS — I482 Chronic atrial fibrillation: Secondary | ICD-10-CM | POA: Diagnosis present

## 2017-02-27 DIAGNOSIS — Z7901 Long term (current) use of anticoagulants: Secondary | ICD-10-CM

## 2017-02-28 ENCOUNTER — Encounter (HOSPITAL_COMMUNITY): Payer: Self-pay | Admitting: Emergency Medicine

## 2017-02-28 ENCOUNTER — Observation Stay (HOSPITAL_COMMUNITY): Payer: Medicare Other

## 2017-02-28 ENCOUNTER — Inpatient Hospital Stay (HOSPITAL_COMMUNITY)
Admission: EM | Admit: 2017-02-28 | Discharge: 2017-03-03 | DRG: 603 | Disposition: A | Discharge status: Home / Self Care | Payer: Medicare Other | Attending: Internal Medicine | Admitting: Internal Medicine

## 2017-02-28 DIAGNOSIS — L03113 Cellulitis of right upper limb: Principal | ICD-10-CM | POA: Diagnosis present

## 2017-02-28 DIAGNOSIS — D509 Iron deficiency anemia, unspecified: Secondary | ICD-10-CM

## 2017-02-28 DIAGNOSIS — N184 Chronic kidney disease, stage 4 (severe): Secondary | ICD-10-CM | POA: Diagnosis present

## 2017-02-28 DIAGNOSIS — I482 Chronic atrial fibrillation: Secondary | ICD-10-CM | POA: Diagnosis not present

## 2017-02-28 DIAGNOSIS — I4821 Permanent atrial fibrillation: Secondary | ICD-10-CM | POA: Diagnosis present

## 2017-02-28 DIAGNOSIS — N390 Urinary tract infection, site not specified: Secondary | ICD-10-CM | POA: Diagnosis present

## 2017-02-28 LAB — URINALYSIS, ROUTINE W REFLEX MICROSCOPIC
Bilirubin Urine: NEGATIVE
GLUCOSE, UA: NEGATIVE mg/dL
Ketones, ur: NEGATIVE mg/dL
Nitrite: NEGATIVE
PH: 5.5 (ref 5.0–8.0)
Specific Gravity, Urine: 1.02 (ref 1.005–1.030)

## 2017-02-28 LAB — CBC WITH DIFFERENTIAL/PLATELET
BASOS ABS: 0 10*3/uL (ref 0.0–0.1)
Basophils Relative: 0 %
EOS ABS: 0 10*3/uL (ref 0.0–0.7)
EOS PCT: 0 %
HCT: 31.1 % — ABNORMAL LOW (ref 39.0–52.0)
HEMOGLOBIN: 9.4 g/dL — AB (ref 13.0–17.0)
Lymphocytes Relative: 8 %
Lymphs Abs: 1.4 10*3/uL (ref 0.7–4.0)
MCH: 23.4 pg — ABNORMAL LOW (ref 26.0–34.0)
MCHC: 30.2 g/dL (ref 30.0–36.0)
MCV: 77.4 fL — ABNORMAL LOW (ref 78.0–100.0)
Monocytes Absolute: 1.1 10*3/uL — ABNORMAL HIGH (ref 0.1–1.0)
Monocytes Relative: 6 %
NEUTROS PCT: 86 %
Neutro Abs: 15.7 10*3/uL — ABNORMAL HIGH (ref 1.7–7.7)
PLATELETS: 181 10*3/uL (ref 150–400)
RBC: 4.02 MIL/uL — AB (ref 4.22–5.81)
RDW: 18.2 % — ABNORMAL HIGH (ref 11.5–15.5)
WBC: 18.2 10*3/uL — AB (ref 4.0–10.5)

## 2017-02-28 LAB — BASIC METABOLIC PANEL
Anion gap: 12 (ref 5–15)
Anion gap: 11 (ref 5–15)
BUN: 67 mg/dL — AB (ref 6–20)
BUN: 59 mg/dL — AB (ref 6–20)
CALCIUM: 8.9 mg/dL (ref 8.9–10.3)
CHLORIDE: 96 mmol/L — AB (ref 101–111)
CO2: 28 mmol/L (ref 22–32)
CO2: 28 mmol/L (ref 22–32)
CREATININE: 2.36 mg/dL — AB (ref 0.61–1.24)
Calcium: 9 mg/dL (ref 8.9–10.3)
Chloride: 98 mmol/L — ABNORMAL LOW (ref 101–111)
Creatinine, Ser: 2.33 mg/dL — ABNORMAL HIGH (ref 0.61–1.24)
GFR calc Af Amer: 26 mL/min — ABNORMAL LOW (ref >60)
GFR, EST AFRICAN AMERICAN: 26 mL/min — AB (ref >60)
GFR, EST NON AFRICAN AMERICAN: 22 mL/min — AB (ref >60)
GFR, EST NON AFRICAN AMERICAN: 22 mL/min — AB (ref >60)
GLUCOSE: 102 mg/dL — AB (ref 65–99)
Glucose, Bld: 168 mg/dL — ABNORMAL HIGH (ref 65–99)
POTASSIUM: 3.9 mmol/L (ref 3.5–5.1)
POTASSIUM: 3.6 mmol/L (ref 3.5–5.1)
SODIUM: 136 mmol/L (ref 135–145)
Sodium: 137 mmol/L (ref 135–145)

## 2017-02-28 LAB — I-STAT CG4 LACTIC ACID, ED: Lactic Acid, Venous: 2.38 mmol/L (ref 0.5–1.9)

## 2017-02-28 LAB — GLUCOSE, CAPILLARY: Glucose-Capillary: 184 mg/dL — ABNORMAL HIGH (ref 65–99)

## 2017-02-28 LAB — URINALYSIS, MICROSCOPIC (REFLEX): SQUAMOUS EPITHELIAL / LPF: NONE SEEN

## 2017-02-28 LAB — PROTIME-INR
INR: 4.88
Prothrombin Time: 45.2 seconds — ABNORMAL HIGH (ref 11.4–15.2)

## 2017-02-28 LAB — CBG MONITORING, ED: Glucose-Capillary: 183 mg/dL — ABNORMAL HIGH (ref 65–99)

## 2017-02-28 LAB — I-STAT TROPONIN, ED: TROPONIN I, POC: 0.04 ng/mL (ref 0.00–0.08)

## 2017-02-28 MED ORDER — ACETAMINOPHEN 650 MG RE SUPP: 650.0000 mg | Freq: Four times a day (QID) | RECTAL | Status: DC | PRN

## 2017-02-28 MED ORDER — METOPROLOL TARTRATE 25 MG PO TABS
50.0000 mg | ORAL_TABLET | Freq: Two times a day (BID) | ORAL | Status: DC
  Administered 2017-02-28 – 2017-03-03 (×7): 50 mg via ORAL
  Filled 2017-02-28 (×7): qty 2

## 2017-02-28 MED ORDER — PANTOPRAZOLE SODIUM 40 MG PO TBEC
40.0000 mg | DELAYED_RELEASE_TABLET | Freq: Every day | ORAL | Status: DC
  Administered 2017-02-28 – 2017-03-03 (×4): 40 mg via ORAL
  Filled 2017-02-28 (×4): qty 1

## 2017-02-28 MED ORDER — LEVOTHYROXINE SODIUM 50 MCG PO TABS
50.0000 ug | ORAL_TABLET | Freq: Every day | ORAL | Status: DC
  Administered 2017-02-28 – 2017-03-03 (×4): 50 ug via ORAL
  Filled 2017-02-28 (×4): qty 1

## 2017-02-28 MED ORDER — CEFAZOLIN SODIUM-DEXTROSE 1-4 GM/50ML-% IV SOLN
1.0000 g | Freq: Once | INTRAVENOUS | Status: AC
  Administered 2017-02-28: 1 g via INTRAVENOUS
  Filled 2017-02-28: qty 50

## 2017-02-28 MED ORDER — FEBUXOSTAT 40 MG PO TABS
40.0000 mg | ORAL_TABLET | Freq: Every day | ORAL | Status: DC
  Administered 2017-02-28 – 2017-03-03 (×4): 40 mg via ORAL
  Filled 2017-02-28 (×4): qty 1

## 2017-02-28 MED ORDER — ACETAMINOPHEN 325 MG PO TABS
650.0000 mg | ORAL_TABLET | Freq: Four times a day (QID) | ORAL | Status: DC | PRN
  Administered 2017-03-01 – 2017-03-02 (×3): 650 mg via ORAL
  Filled 2017-02-28 (×3): qty 2

## 2017-02-28 MED ORDER — ENSURE ENLIVE PO LIQD
237.0000 mL | Freq: Two times a day (BID) | ORAL | Status: DC
  Administered 2017-02-28 – 2017-03-02 (×5): 237 mL via ORAL

## 2017-02-28 MED ORDER — VANCOMYCIN HCL IN DEXTROSE 1-5 GM/200ML-% IV SOLN
1000.0000 mg | Freq: Once | INTRAVENOUS | Status: AC
  Administered 2017-02-28: 1000 mg via INTRAVENOUS
  Filled 2017-02-28: qty 200

## 2017-02-28 MED ORDER — ONDANSETRON HCL 4 MG/2ML IJ SOLN: 4.0000 mg | Freq: Four times a day (QID) | INTRAMUSCULAR | Status: DC | PRN

## 2017-02-28 MED ORDER — PRAVASTATIN SODIUM 40 MG PO TABS
40.0000 mg | ORAL_TABLET | Freq: Every day | ORAL | Status: DC
  Administered 2017-02-28 – 2017-03-03 (×4): 40 mg via ORAL
  Filled 2017-02-28 (×3): qty 1
  Filled 2017-02-28 (×4): qty 2
  Filled 2017-02-28: qty 1

## 2017-02-28 MED ORDER — ONDANSETRON HCL 4 MG PO TABS: 4.0000 mg | ORAL_TABLET | Freq: Four times a day (QID) | ORAL | Status: DC | PRN

## 2017-02-28 MED ORDER — VANCOMYCIN HCL IN DEXTROSE 750-5 MG/150ML-% IV SOLN
750.0000 mg | INTRAVENOUS | Status: DC
  Administered 2017-03-02: 750 mg via INTRAVENOUS
  Filled 2017-02-28: qty 150

## 2017-02-28 MED ORDER — ASPIRIN EC 81 MG PO TBEC
81.0000 mg | DELAYED_RELEASE_TABLET | Freq: Every day | ORAL | Status: DC
  Administered 2017-02-28 – 2017-03-03 (×4): 81 mg via ORAL
  Filled 2017-02-28 (×4): qty 1

## 2017-02-28 MED ORDER — PREDNISONE 5 MG PO TABS
5.0000 mg | ORAL_TABLET | Freq: Every day | ORAL | Status: DC
  Administered 2017-02-28 – 2017-03-03 (×4): 5 mg via ORAL
  Filled 2017-02-28 (×4): qty 1

## 2017-02-28 NOTE — Progress Notes (Signed)
I have seen and assessed patient and agree with Dr. William Hamburger assessment and plan. Patient is a pleasant 83 year old gentleman history of prior PE and atrial fibrillation on chronic anticoagulation with Coumadin, history of non-STEMI EF 50-55%, mild-to-moderate pulmonary hypertension, hypertension presented to the ED with a right upper extremity cellulitis. Patient also noted to have chronic Foley catheter. Patient admitted placed on empiric IV antibiotics. Will have nursing asked change patient's Foley catheter and check a UA with cultures and sensitivities. Follow.  No charge.

## 2017-02-28 NOTE — Progress Notes (Signed)
Pharmacy Antibiotic and Anticoagulation Note  Troy [name] is a 83 y.o. male with arm swelling/redness admitted on 02/28/2017 with cellulitis.  Pharmacy has been consulted for vancomycin and warfarin (on PTA for a-fib) dosing.  Plan: Vancomycin 1 gm x1 then 750 mg IV q48h next due 10/8 F/u scr/culture/levels Daily PT/INR No warfarin today (INR=4.88)  Height:  (172.7 cm) Weight: 150 lb (68 kg) IBW/kg (Calculated) : 68.4  Temp (24hrs), Avg:99 F (37.2 C), Min:99 F (37.2 C), Max:99 F (37.2 C)   Recent Labs Lab 02/28/17 0108 02/28/17 0135  WBC 18.2*  --   CREATININE 2.36*  --   LATICACIDVEN  --  2.38*    Estimated Creatinine Clearance: 18.4 mL/min (A) (by C-G formula based on SCr of 2.36 mg/dL (H)).    No Known Allergies  Antimicrobials this admission: 10/6 ancef >>  10/6 vancomycin >>   Dose adjustments this admission:   Microbiology results:  BCx:   UCx:    Sputum:    MRSA PCR:   Thank you for allowing pharmacy to be a part of this patient's care.  Lorenza Evangelist 02/28/2017 3:18 AM

## 2017-02-28 NOTE — H&P (Signed)
History and Physical    Troy [name] EAV:[id number] DOB: [birth year] DOA: 02/28/2017  PCP: Pearson Grippe, MD  Patient coming from: Home  I have personally briefly reviewed patient's old medical records in [facility]  Chief Complaint: Arm Redness / swelling  HPI: Troy [name] is a 83 y.o. male with medical history significant of chronic coumadin for prior PE and AF, NSTEMI EF 50-55%, mild to mod pulm HTN, HTN.  Patient presents to the ED with family with R arm erythema, swelling, pain.  Ongoing for past 2-3 days.  Patient has some memory difficulties and cant say when it exactly started but it wasn't there when family last visited 4 days ago.  Some pain with this but not severe.  Most painful inner elbow.  No fevers, but decreased PO intake past couple of days.   ED Course: Tm 99, WBC 18k, INR 4.88.   Review of Systems: As per HPI otherwise 10 point review of systems negative.   Past Medical History:  Diagnosis Date  . Chronic anticoagulation 2011   on coumadin  . Coronary artery disease 2012   PCI-LAD & LCX, Promus Des  . Echocardiogram abnormal 11/07/2010   EF 50-55%, LA mild to mod dilated, mod MR, RV SYSTOLIC PRESSure 40-50 mild-mod pul. HTN   . H/O cardiovascular stress test 11/07/2010   new scar in LCX distribution  . Hyperlipidemia LDL goal < 70   . Hypertension   . Myocardial infarction (HCC) 05/23/2010   non-ST elevation MI  . Permanent atrial fibrillation (HCC)   . Pulmonary embolus (HCC) 05/23/2010  . Pulmonary hypertension (HCC)    mild to moderate    Past Surgical History:  Procedure Laterality Date  . CARDIAC CATHETERIZATION  05/23/10   high grade LAD and circumflex disease, EF 50% stented circumflex w/ Promus drug-eluding stent  . CARDIAC CATHETERIZATION  05/28/2010   LAD sten w/ Promus drug eluding stent  . CORONARY ANGIOPLASTY WITH STENT PLACEMENT       reports that he quit smoking about 43 years ago. He has never used smokeless tobacco. He  reports that he does not drink alcohol or use drugs.  No Known Allergies  Family History  Problem Relation Age of Onset  . Family history unknown: Yes     Prior to Admission medications   Medication Sig Start Date End Date Taking? Authorizing Provider  aspirin 81 MG EC tablet Take 81 mg by mouth daily.     Yes [provider]  feeding supplement, ENSURE ENLIVE, (ENSURE ENLIVE) LIQD Take 237 mLs by mouth 2 (two) times daily between meals. 08/15/16  Yes Albertine Grates, MD  furosemide (LASIX) 40 MG tablet Take 1 tablet (40 mg total) by mouth daily. PATIENT NEEDS TO CONTACT OFFICE FOR ADDITIONAL REFILLS 08/14/16  Yes Albertine Grates, MD  levothyroxine (SYNTHROID, LEVOTHROID) 50 MCG tablet Take 50 mcg by mouth daily. 02/16/15  Yes [provider]  metoprolol (LOPRESSOR) 50 MG tablet Take 1 tablet (50 mg total) by mouth 2 (two) times daily. 08/15/16  Yes Runell Gess, MD  nitroGLYCERIN (NITROSTAT) 0.4 MG SL tablet Place 0.4 mg under the tongue every 5 (five) minutes as needed.   Yes [provider]  pantoprazole (PROTONIX) 40 MG tablet TAKE 1 TABLET (40 MG TOTAL) BY MOUTH DAILY. 08/28/16  Yes Runell Gess, MD  pravastatin (PRAVACHOL) 40 MG tablet Take 40 mg by mouth daily. 02/07/13  Yes [provider]  predniSONE (DELTASONE) 5 MG tablet Take 5  mg by mouth daily.     Yes [provider]  ULORIC 40 MG tablet TAKE 1 TABLET BY MOUTH EVERY DAY 08/04/16  Yes Runell Gess, MD  warfarin (COUMADIN) 2.5 MG tablet Take 2.5 mg by mouth daily.    Yes [provider]    Physical Exam: Vitals:   02/28/17 0200 02/28/17 0230 02/28/17 0300 02/28/17 0330  BP: 120/65 122/69 124/73 140/74  Pulse: 75 73 79 70  Resp: Temp:      TempSrc:      SpO2: 93% 96% 96% (!) 88%  Weight:      Height:        Constitutional: NAD, calm, comfortable Eyes: PERRL, lids and conjunctivae normal ENMT: Mucous membranes are moist. Posterior pharynx clear of any exudate  or lesions.Normal dentition.  Neck: normal, supple, no masses, no thyromegaly Respiratory: clear to auscultation bilaterally, no wheezing, no crackles. Normal respiratory effort. No accessory muscle use.  Cardiovascular: Regular rate and rhythm, no murmurs / rubs / gallops. No extremity edema. 2+ pedal pulses. No carotid bruits.  Abdomen: no tenderness, no masses palpated. No hepatosplenomegaly. Bowel sounds positive.  Musculoskeletal: no clubbing / cyanosis. No joint deformity upper and lower extremities. Good ROM, no contractures. Normal muscle tone.  Skin: Erythema of forearm, hand, and R upper arm, swelling worst over inner elbow with fluctuance.  No pain or tenderness beyond sight of erythema, no sub q air.  No pain out of proportion to exam. Neurologic: CN 2-12 grossly intact. Sensation intact, DTR normal. Strength 5/5 in all 4.  Psychiatric: Normal judgment and insight. Alert and oriented x 3. Normal mood.    Labs on Admission: I have personally reviewed following labs and imaging studies  CBC:  Recent Labs Lab 02/28/17 0108  WBC 18.2*  NEUTROABS 15.7*  HGB 9.4*  HCT 31.1*  MCV 77.4*  PLT 181   Basic Metabolic Panel:  Recent Labs Lab 02/28/17 0108  NA 136  K 3.9  CL 96*  CO2 28  GLUCOSE 168*  BUN 67*  CREATININE 2.36*  CALCIUM 9.0   GFR: Estimated Creatinine Clearance: 18.4 mL/min (A) (by C-G formula based on SCr of 2.36 mg/dL (H)). Liver Function Tests: No results for input(s): AST, ALT, ALKPHOS, BILITOT, PROT, ALBUMIN in the last 168 hours. No results for input(s): LIPASE, AMYLASE in the last 168 hours. No results for input(s): AMMONIA in the last 168 hours. Coagulation Profile:  Recent Labs Lab 02/28/17 0108  INR 4.88*   Cardiac Enzymes: No results for input(s): CKTOTAL, CKMB, CKMBINDEX, TROPONINI in the last 168 hours. BNP (last 3 results) No results for input(s): PROBNP in the last 8760 hours. HbA1C: No results for input(s): HGBA1C in the last 72  hours. CBG:  Recent Labs Lab 02/28/17 0016  GLUCAP 183*   Lipid Profile: No results for input(s): CHOL, HDL, LDLCALC, TRIG, CHOLHDL, LDLDIRECT in the last 72 hours. Thyroid Function Tests: No results for input(s): TSH, T4TOTAL, FREET4, T3FREE, THYROIDAB in the last 72 hours. Anemia Panel: No results for input(s): VITAMINB12, FOLATE, FERRITIN, TIBC, IRON, RETICCTPCT in the last 72 hours. Urine analysis:    Component Value Date/Time   COLORURINE AMBER (A) 08/06/2016 1930   APPEARANCEUR CLOUDY (A) 08/06/2016 1930   LABSPEC 1.012 08/06/2016 1930   PHURINE 5.5 08/06/2016 1930   GLUCOSEU NEGATIVE 08/06/2016 1930   HGBUR SMALL (A) 08/06/2016 1930   BILIRUBINUR SMALL (A) 08/06/2016 1930   KETONESUR NEGATIVE 08/06/2016 1930   PROTEINUR 30 (  A) 08/06/2016 1930   UROBILINOGEN 0.2 01/20/2010 1315   NITRITE NEGATIVE 08/06/2016 1930   LEUKOCYTESUR NEGATIVE 08/06/2016 1930    Radiological Exams on Admission: No results found.  EKG: Independently reviewed.  Assessment/Plan Principal Problem:   Cellulitis of right arm Active Problems:   Permanent atrial fibrillation (HCC)   CKD (chronic kidney disease) stage 4, GFR 15-29 ml/min (HCC)    1. Cellulitis of R arm - suspicious for bursitis / abscess of medial elbow 1. Vanc empirically for now 2. CT non-contrast of RUE 1. Surgical consult if indicated by CT findings 3. Will "hydrate" patient by holding lasix 2. PAF - 1. Continue toprol 2. Coumadin per pharm, INR is currently supratheraputic though so expect this will be held today obviously 3. CKD stage 4 - 1. Daily BMP to monitor creatinine while on Vanc  DVT prophylaxis: Coumadin Code Status: DNR - confirmed with daughter Family Communication: Family at bedside Disposition Plan: Home after admit Consults called: None Admission status: Place in [place], [place]. DO [facility] Pager [phone]  If 7AM-7PM, please contact day team taking care of  patient [email or web address] Password [email or web address]  02/28/2017, 3:41 AM

## 2017-02-28 NOTE — ED Provider Notes (Signed)
[facility]-EMERGENCY DEPT Provider Note   CSN: [id number] Arrival date & time: 02/27/17  2342     History   Chief Complaint Chief Complaint  Patient presents with  . Arm Swelling    HPI Troy [name] is a 83 y.o. male.  HPI  83 year old male who is chronically on warfarin for prior DVT who also has CAD, chronic kidney disease presents with right arm redness and swelling. This is been ongoing for the last 2 or 3 days. He has some early memory issues but family notes that 4 days ago the redness and swelling was not there. There is some pain associated with it but not overtly. It is most painful on the inner elbow. However no decreased range of motion of the elbow. The redness seems to be tracking up. No fevers but the patient has been eating less and had some nausea over the last couple days.  Past Medical History:  Diagnosis Date  . Chronic anticoagulation 2011   on coumadin  . Coronary artery disease 2012   PCI-LAD & LCX, Promus Des  . Echocardiogram abnormal 11/07/2010   EF 50-55%, LA mild to mod dilated, mod MR, RV SYSTOLIC PRESSure 40-50 mild-mod pul. HTN   . H/O cardiovascular stress test 11/07/2010   new scar in LCX distribution  . Hyperlipidemia LDL goal < 70   . Hypertension   . Myocardial infarction (HCC) 05/23/2010   non-ST elevation MI  . Permanent atrial fibrillation (HCC)   . Pulmonary embolus (HCC) 05/23/2010  . Pulmonary hypertension (HCC)    mild to moderate    Patient Active Problem List   Diagnosis Date Noted  . AKI (acute kidney injury) (HCC) 08/07/2016  . Urinary retention 08/07/2016  . Abdominal pain 08/06/2016  . Permanent atrial fibrillation (HCC) 03/28/2013  . Pulmonary embolus, 04/2010 03/28/2013  . Coronary artery disease   . Hypertension   . Hyperlipidemia LDL goal < 70   . Long term current use of anticoagulant therapy 08/10/2012  . Myocardial infarction (HCC) 05/23/2010    Past Surgical History:  Procedure Laterality Date  . CARDIAC  CATHETERIZATION  05/23/10   high grade LAD and circumflex disease, EF 50% stented circumflex w/ Promus drug-eluding stent  . CARDIAC CATHETERIZATION  05/28/2010   LAD sten w/ Promus drug eluding stent  . CORONARY ANGIOPLASTY WITH STENT PLACEMENT         Home Medications    Prior to Admission medications   Medication Sig Start Date End Date Taking? Authorizing Provider  aspirin 81 MG EC tablet Take 81 mg by mouth daily.     Yes [provider]  feeding supplement, ENSURE ENLIVE, (ENSURE ENLIVE) LIQD Take 237 mLs by mouth 2 (two) times daily between meals. 08/15/16  Yes Albertine Grates, MD  furosemide (LASIX) 40 MG tablet Take 1 tablet (40 mg total) by mouth daily. PATIENT NEEDS TO CONTACT OFFICE FOR ADDITIONAL REFILLS 08/14/16  Yes Albertine Grates, MD  levothyroxine (SYNTHROID, LEVOTHROID) 50 MCG tablet Take 50 mcg by mouth daily. 02/16/15  Yes [provider]  metoprolol (LOPRESSOR) 50 MG tablet Take 1 tablet (50 mg total) by mouth 2 (two) times daily. 08/15/16  Yes Runell Gess, MD  nitroGLYCERIN (NITROSTAT) 0.4 MG SL tablet Place 0.4 mg under the tongue every 5 (five) minutes as needed.   Yes [provider]  pantoprazole (PROTONIX) 40 MG tablet TAKE 1 TABLET (40 MG TOTAL) BY MOUTH DAILY. 08/28/16  Yes Runell Gess, MD  pravastatin (PRAVACHOL) 40 MG  tablet Take 40 mg by mouth daily. 02/07/13  Yes [provider]  predniSONE (DELTASONE) 5 MG tablet Take 5 mg by mouth daily.     Yes [provider]  ULORIC 40 MG tablet TAKE 1 TABLET BY MOUTH EVERY DAY 08/04/16  Yes Runell Gess, MD  warfarin (COUMADIN) 2.5 MG tablet Take 2.5 mg by mouth daily.    Yes [provider]  tamsulosin (FLOMAX) 0.4 MG CAPS capsule Take 2 capsules (0.8 mg total) by mouth at bedtime. Patient not taking: Reported on 02/28/2017 08/14/16   Albertine Grates, MD    Family History Family History  Problem Relation Age of Onset  . Family history unknown: Yes    Social  History Social History  Substance Use Topics  . Smoking status: Former Smoker    Quit date: 09/27/1973  . Smokeless tobacco: Never Used  . Alcohol use No     Allergies   Patient has no known allergies.   Review of Systems Review of Systems  Constitutional: Negative for fever.  Respiratory: Negative for shortness of breath.   Cardiovascular: Negative for chest pain.  Gastrointestinal: Positive for nausea. Negative for vomiting.  Musculoskeletal:       Right arm swelling, redness  Skin: Positive for color change.  All other systems reviewed and are negative.    Physical Exam Updated Vital Signs BP 122/69   Pulse 73   Temp 99 F (37.2 C) (Oral)   Resp 18   Ht  (1.727 m)   Wt 68 kg (150 lb)   SpO2 96%   BMI 22.81 kg/m   Physical Exam  Constitutional: He is oriented to person, place, and time. He appears well-developed and well-nourished.  HENT:  Head: Normocephalic and atraumatic.  Right Ear: External ear normal.  Left Ear: External ear normal.  Nose: Nose normal.  Eyes: Right eye exhibits no discharge. Left eye exhibits no discharge.  Neck: Neck supple.  Cardiovascular: Normal rate, regular rhythm and normal heart sounds.   Pulses:      Radial pulses are 2+ on the right side, and 2+ on the left side.  Pulmonary/Chest: Effort normal and breath sounds normal.  Abdominal: Soft. There is no tenderness.  Musculoskeletal: He exhibits no edema.  Right upper extremity, mostly the forearm and hand are diffusely swollen, mildly. There is diffuse erythema with mild warmth. There is no focal fluctuance. Normal range of motion of the elbow. The redness extends from the wrist to the mid upper arm. No streaking. Normal strength/sensation.  Neurological: He is alert and oriented to person, place, and time.  Skin: Skin is warm and dry.  Nursing note and vitals reviewed.    ED Treatments / Results  Labs (all labs ordered are listed, but only abnormal results are  displayed) Labs Reviewed  BASIC METABOLIC PANEL - Abnormal; Notable for the following:       Result Value   Chloride 96 (*)    Glucose, Bld 168 (*)    BUN 67 (*)    Creatinine, Ser 2.36 (*)    GFR calc non Af Amer 22 (*)    GFR calc Af Amer 26 (*)    All other components within normal limits  CBC WITH DIFFERENTIAL/PLATELET - Abnormal; Notable for the following:    WBC 18.2 (*)    RBC 4.02 (*)    Hemoglobin 9.4 (*)    HCT 31.1 (*)    MCV 77.4 (*)    MCH 23.4 (*)  RDW 18.2 (*)    Neutro Abs 15.7 (*)    Monocytes Absolute 1.1 (*)    All other components within normal limits  PROTIME-INR - Abnormal; Notable for the following:    Prothrombin Time 45.2 (*)    INR 4.88 (*)    All other components within normal limits  CBG MONITORING, ED - Abnormal; Notable for the following:    Glucose-Capillary 183 (*)    All other components within normal limits  I-STAT CG4 LACTIC ACID, ED - Abnormal; Notable for the following:    Lactic Acid, Venous 2.38 (*)    All other components within normal limits  I-STAT TROPONIN, ED    EKG  EKG Interpretation None       Radiology No results found.  Procedures Procedures (including critical care time)  Medications Ordered in ED Medications  vancomycin (VANCOCIN) IVPB 1000 mg/200 mL premix (1,000 mg Intravenous New Bag/Given 02/28/17 0252)  ceFAZolin (ANCEF) IVPB 1 g/50 mL premix (0 g Intravenous Stopped 02/28/17 0246)     Initial Impression / Assessment and Plan / ED Course  I have reviewed the triage vital signs and the nursing notes.  Pertinent labs & imaging results that were available during my care of the patient were reviewed by me and considered in my medical decision making (see chart for details).     Patient's presentation is consistent with cellulitis. While DVT would be a possibility of things this is less likely, especially with a supratherapeutic INR. He has other concerning findings such as mild lactic acidosis and new  WBC of 18,000. He otherwise appears well and does not have an acute fever or hypotension. However given his age and comorbidities, he will be started on IV antibiotics. He has no significant tenderness, crepitus or other signs of a deep space infection such as uncontrolled pain. He is sitting very comfortably. Admit to the hospitalist for IV antibiotics.  Final Clinical Impressions(s) / ED Diagnoses   Final diagnoses:  Right arm cellulitis    New Prescriptions New Prescriptions   No medications on file     Pricilla Loveless, MD 02/28/17 [phone]

## 2017-02-28 NOTE — Progress Notes (Signed)
A consult was received from an ED physician for vancomycin per pharmacy dosing.  The patient's profile has been reviewed for ht/wt/allergies/indication/available labs.   A one time order has been placed for vancomycin 1 Gm.  Further antibiotics/pharmacy consults should be ordered by admitting physician if indicated.                       Thank you, Lorenza Evangelist 02/28/2017  2:05 AM

## 2017-02-28 NOTE — ED Triage Notes (Signed)
Patient comes with a right arm reddened. Patient was concern that he has a blood clot. Patient has a history of blood clots. Home health agency told patient to come to the ED. Patient has a pulse but is weak.

## 2017-02-28 NOTE — Progress Notes (Signed)
MD notified of new lab result INR= 4.96.

## 2017-03-01 DIAGNOSIS — N184 Chronic kidney disease, stage 4 (severe): Secondary | ICD-10-CM | POA: Diagnosis not present

## 2017-03-01 DIAGNOSIS — Z955 Presence of coronary angioplasty implant and graft: Secondary | ICD-10-CM | POA: Diagnosis not present

## 2017-03-01 DIAGNOSIS — L03113 Cellulitis of right upper limb: Secondary | ICD-10-CM | POA: Diagnosis not present

## 2017-03-01 DIAGNOSIS — I482 Chronic atrial fibrillation: Secondary | ICD-10-CM | POA: Diagnosis not present

## 2017-03-01 DIAGNOSIS — Z86711 Personal history of pulmonary embolism: Secondary | ICD-10-CM | POA: Diagnosis not present

## 2017-03-01 DIAGNOSIS — D509 Iron deficiency anemia, unspecified: Secondary | ICD-10-CM

## 2017-03-01 DIAGNOSIS — D539 Nutritional anemia, unspecified: Secondary | ICD-10-CM | POA: Diagnosis present

## 2017-03-01 DIAGNOSIS — I480 Paroxysmal atrial fibrillation: Secondary | ICD-10-CM | POA: Diagnosis present

## 2017-03-01 DIAGNOSIS — I272 Pulmonary hypertension, unspecified: Secondary | ICD-10-CM | POA: Diagnosis present

## 2017-03-01 DIAGNOSIS — I2510 Atherosclerotic heart disease of native coronary artery without angina pectoris: Secondary | ICD-10-CM | POA: Diagnosis present

## 2017-03-01 DIAGNOSIS — Z66 Do not resuscitate: Secondary | ICD-10-CM | POA: Diagnosis present

## 2017-03-01 DIAGNOSIS — Z7901 Long term (current) use of anticoagulants: Secondary | ICD-10-CM | POA: Diagnosis not present

## 2017-03-01 DIAGNOSIS — Z79899 Other long term (current) drug therapy: Secondary | ICD-10-CM | POA: Diagnosis not present

## 2017-03-01 DIAGNOSIS — I129 Hypertensive chronic kidney disease with stage 1 through stage 4 chronic kidney disease, or unspecified chronic kidney disease: Secondary | ICD-10-CM | POA: Diagnosis present

## 2017-03-01 DIAGNOSIS — Z7982 Long term (current) use of aspirin: Secondary | ICD-10-CM | POA: Diagnosis not present

## 2017-03-01 DIAGNOSIS — N390 Urinary tract infection, site not specified: Secondary | ICD-10-CM | POA: Diagnosis present

## 2017-03-01 DIAGNOSIS — Z87891 Personal history of nicotine dependence: Secondary | ICD-10-CM | POA: Diagnosis not present

## 2017-03-01 DIAGNOSIS — I252 Old myocardial infarction: Secondary | ICD-10-CM | POA: Diagnosis not present

## 2017-03-01 LAB — VITAMIN B12: VITAMIN B 12: 700 pg/mL (ref 180–914)

## 2017-03-01 LAB — FERRITIN: Ferritin: 74 ng/mL (ref 24–336)

## 2017-03-01 LAB — BASIC METABOLIC PANEL
ANION GAP: 8 (ref 5–15)
BUN: 61 mg/dL — ABNORMAL HIGH (ref 6–20)
CALCIUM: 8.8 mg/dL — AB (ref 8.9–10.3)
CO2: 30 mmol/L (ref 22–32)
CREATININE: 2.25 mg/dL — AB (ref 0.61–1.24)
Chloride: 98 mmol/L — ABNORMAL LOW (ref 101–111)
GFR, EST AFRICAN AMERICAN: 27 mL/min — AB (ref >60)
GFR, EST NON AFRICAN AMERICAN: 23 mL/min — AB (ref >60)
Glucose, Bld: 124 mg/dL — ABNORMAL HIGH (ref 65–99)
Potassium: 3.7 mmol/L (ref 3.5–5.1)
Sodium: 136 mmol/L (ref 135–145)

## 2017-03-01 LAB — URINE CULTURE: Culture: <10000 — AB

## 2017-03-01 LAB — IRON AND TIBC
Iron: 16 ug/dL — ABNORMAL LOW (ref 45–182)
Saturation Ratios: 5 % — ABNORMAL LOW (ref 17.9–39.5)
TIBC: 335 ug/dL (ref 250–450)
UIBC: 319 ug/dL

## 2017-03-01 LAB — PROTIME-INR
INR: 4.96
INR: 4.72
PROTHROMBIN TIME: 44 s — AB (ref 11.4–15.2)
Prothrombin Time: 45.8 seconds — ABNORMAL HIGH (ref 11.4–15.2)

## 2017-03-01 LAB — LACTIC ACID, PLASMA: LACTIC ACID, VENOUS: 1.3 mmol/L (ref 0.5–1.9)

## 2017-03-01 LAB — FOLATE: FOLATE: 35 ng/mL (ref >5.9)

## 2017-03-01 LAB — GLUCOSE, CAPILLARY: GLUCOSE-CAPILLARY: 270 mg/dL — AB (ref 65–99)

## 2017-03-01 MED ORDER — DEXTROSE 5 % IV SOLN
1.0000 g | INTRAVENOUS | Status: DC
  Administered 2017-03-01 – 2017-03-02 (×2): 1 g via INTRAVENOUS
  Filled 2017-03-01 (×2): qty 10

## 2017-03-01 MED ORDER — FERUMOXYTOL INJECTION 510 MG/17 ML
510.0000 mg | Freq: Once | INTRAVENOUS | Status: AC
  Administered 2017-03-01: 510 mg via INTRAVENOUS
  Filled 2017-03-01: qty 17

## 2017-03-01 NOTE — Progress Notes (Signed)
CRITICAL VALUE ALERT  Critical Value:  INR 4.72  Date & Time Notified: 10/7 0448  Provider Notified: Opyd MD [phone]  Orders Received/Actions taken: none

## 2017-03-01 NOTE — Care Management Note (Addendum)
Case Management Note  Patient Details  Name: Troy [name] MRN: [id number] Date of Birth: Sep 22, 1920  Subjective/Objective:    Cellulitis of right arm, CKD, PAF                Action/Plan: Discharge Planning: NCM spoke to pt and dtr, Teresa at bedside. Pt has RW, wheelchair, and several canes at home.  Pt is active with [facility] for [facility] RN. They also do his foley care. Pt declines HHPT. Will need resumption of care orders. Will continue to follow for dc needs.   PCP Pearson Grippe MD  Expected Discharge Date:               Expected Discharge Plan:  Home w Home Health Services  In-House Referral:  NA  Discharge planning Services  CM Consult  Post Acute Care Choice:  Home Health, Resumption of Svcs/PTA Provider Choice offered to:  Adult Children  DME Arranged:  N/A DME Agency:  NA  HH Arranged:  RN HH Agency:  [facility]  Status of Service:  In process, will continue to follow  If discussed at Long Length of Stay Meetings, dates discussed:    Additional Comments:  Elliot Cousin, RN 03/01/2017, 3:27 PM

## 2017-03-01 NOTE — Progress Notes (Signed)
PROGRESS NOTE    Troy [name]  WUJ:[id number] DOB: Sep 27, 1920 DOA: 02/28/2017 PCP: Pearson Grippe, MD    Brief Narrative:  Troy [name] is a 83 y.o. male with medical history significant of chronic coumadin for prior PE and AF, NSTEMI EF 50-55%, mild to mod pulm HTN, HTN.  Patient presents to the ED with family with R arm erythema, swelling, pain.  Ongoing for past 2-3 days.  Patient has some memory difficulties and cant say when it exactly started but it wasn't there when family last visited 4 days ago.  Some pain with this but not severe.  Most painful inner elbow.  No fevers, but decreased PO intake past couple of days.   ED Course: Tm 99, WBC 18k, INR 4.88.   Assessment & Plan:   Principal Problem:   Cellulitis of right arm Active Problems:   Permanent atrial fibrillation (HCC)   CKD (chronic kidney disease) stage 4, GFR 15-29 ml/min (HCC)   Acute lower UTI  #1 right upper extremity cellulitis Patient presented with a right upper extremity cellulitis. CT right upper extremity negative for any abscess formation. Minimal improvement with cellulitis still requiring IV antibiotics. [name] right upper extremity elevated. Follow.  #2 probable acute UTI Urinalysis consistent with a UTI. Urine cultures pending. Continue IV Rocephin while awaiting cultures and sensitivities. Follow.  #3 chronic kidney disease stage IV Stable. Currently at baseline.  #5 paroxysmal atrial fibrillation Currently rate controlled on Toprol. INR supratherapeutic. Coumadin on hold. Coumadin per pharmacy.  #6 chronic Foley catheter Status post Foley catheter exchange per RN 02/28/2017. Resume home regimen Flomax as 0.4 mg daily.  #7 iron deficiency anemia/anemia of chronic disease Anemia panel consistent with a iron deficiency anemia. Iron level at 16. Ferritin is 74. Folate at 35. B-12 levels at 700. Will give a dose of IV Feraheme 1. Outpatient follow-up with nephrology.    DVT prophylaxis:  Supratherapeutic INR. Was on Coumadin prior to admission. Code Status: DO NOT RESUSCITATE Family Communication: Updated patient. No family at bedside. Disposition Plan: Likely back home once right upper extremity cellulitis has improved and patient on oral antibiotics.   Consultants:   None  Procedures:   CT right upper extremity 02/28/2017  Antimicrobials:   IV vancomycin 02/28/2017  IV Rocephin 03/01/2017   Subjective: Patient in bed. No chest. No shortness of breath. Patient denies any bleeding.  Objective: Vitals:   02/28/17 1100 02/28/17 1254 02/28/17 2221 03/01/17 0614  BP: (!) 120/57 (!) 104/57 109/63 132/65  Pulse: 71 83 100 70  Resp:  Temp:  99.5 F (37.5 C) 98.1 F (36.7 C) 97.9 F (36.6 C)  TempSrc:   Oral Oral  SpO2:  96% 97% 99%  Weight:      Height:        Intake/Output Summary (Last 24 hours) at 03/01/17 1239 Last data filed at 03/01/17 0955  Gross per 24 hour  Intake              480 ml  Output              750 ml  Net             -270 ml   Filed Weights   02/27/17 2354 02/28/17 0646  Weight: 68 kg (150 lb) 68 kg (149 lb 14.6 oz)    Examination:  General exam: NAD Respiratory system: Clear to auscultation. Respiratory effort normal. Cardiovascular system: S1 & S2 heard, RRR. No JVD, murmurs,  rubs, gallops or clicks. No pedal edema. Gastrointestinal system: Abdomen is nondistended, soft and nontender. No organomegaly or masses felt. Normal bowel sounds heard. Central nervous system: Alert and oriented. No focal neurological deficits. Extremities: RUE with erythema, warmth, some edema. Skin: RUE cellulitis Psychiatry: Judgement and insight appear normal. Mood & affect appropriate.     Data Reviewed: I have personally reviewed following labs and imaging studies  CBC:  Recent Labs Lab 02/28/17 0108  WBC 18.2*  NEUTROABS 15.7*  HGB 9.4*  HCT 31.1*  MCV 77.4*  PLT 181   Basic Metabolic Panel:  Recent Labs Lab  02/28/17 0108 02/28/17 0725 03/01/17 0359  NA 136 137 136  K 3.9 3.6 3.7  CL 96* 98* 98*  CO2 GLUCOSE 168* 102* 124*  BUN 67* 59* 61*  CREATININE 2.36* 2.33* 2.25*  CALCIUM 9.0 8.9 8.8*   GFR: Estimated Creatinine Clearance: 19.3 mL/min (A) (by C-G formula based on SCr of 2.25 mg/dL (H)). Liver Function Tests: No results for input(s): AST, ALT, ALKPHOS, BILITOT, PROT, ALBUMIN in the last 168 hours. No results for input(s): LIPASE, AMYLASE in the last 168 hours. No results for input(s): AMMONIA in the last 168 hours. Coagulation Profile:  Recent Labs Lab 02/28/17 0108 02/28/17 0725 03/01/17 0359  INR 4.88* 4.96* 4.72*   Cardiac Enzymes: No results for input(s): CKTOTAL, CKMB, CKMBINDEX, TROPONINI in the last 168 hours. BNP (last 3 results) No results for input(s): PROBNP in the last 8760 hours. HbA1C: No results for input(s): HGBA1C in the last 72 hours. CBG:  Recent Labs Lab 02/28/17 0016 02/28/17 2058  GLUCAP 183* 184*   Lipid Profile: No results for input(s): CHOL, HDL, LDLCALC, TRIG, CHOLHDL, LDLDIRECT in the last 72 hours. Thyroid Function Tests: No results for input(s): TSH, T4TOTAL, FREET4, T3FREE, THYROIDAB in the last 72 hours. Anemia Panel: No results for input(s): VITAMINB12, FOLATE, FERRITIN, TIBC, IRON, RETICCTPCT in the last 72 hours. Sepsis Labs:  Recent Labs Lab 02/28/17 0135 03/01/17 0849  LATICACIDVEN 2.38* 1.3    Recent Results (from the past 240 hour(s))  Culture, Urine     Status: Abnormal   Collection Time: 02/28/17  1:59 PM  Result Value Ref Range Status   Specimen Description URINE, CATHETERIZED  Final   Special Requests NONE  Final   Culture (A)  Final    <10,000 COLONIES/mL INSIGNIFICANT GROWTH Performed at [facility], [address], [place], [place] [address]    Report Status 03/01/2017 FINAL  Final         Radiology Studies: Ct Extrem Up Entire Arm R Wo/cm  Result Date: 02/28/2017 CLINICAL DATA:   Redness in the right arm. History of blood clots. Weak pulse. EXAM: CT OF THE UPPER RIGHT EXTREMITY WITHOUT CONTRAST TECHNIQUE: Multidetector CT imaging of the upper right extremity was performed according to the standard protocol. COMPARISON:  None. FINDINGS: Bones/Joint/Cartilage Degenerative changes in the glenohumeral joint with osteophytes on the glenoid and humeral surfaces. Old appearing ossicles adjacent to the greater tuberosity may represent tendinous calcifications or old avulsion fragments. Mild degenerative changes in the elbow. Old ununited ossicles over the olecranon process. No acute fracture is identified. No destructive or expansile bone lesions. Ligaments Suboptimally assessed by CT. Muscles and Tendons No intramuscular mass or infiltration.  Fat planes are distinct. Soft tissues No significant soft tissue infiltration is identified. No loculated fluid collections. Mild skin thickening suggested along the posterior aspect of the distal humerus and forearm. This could indicate cellulitis  or edema. Vascular calcifications in the tracheal, radial, and ulnar artery's. MRI would be more sensitive for evaluation of soft tissues if clinically indicated. IMPRESSION: 1. No acute bony abnormalities. Degenerative changes in the glenohumeral and elbow joints. 2. Skin thickening suggested along the posterior aspect of the distal humerus and forearm which could indicate cellulitis or edema. No discrete abscess identified. 3. Vascular calcifications. Electronically Signed   By: Burman Nieves M.D.   On: 02/28/2017 04:52        Scheduled Meds: . aspirin EC  81 mg Oral Daily  . febuxostat  40 mg Oral Daily  . feeding supplement (ENSURE ENLIVE)  237 mL Oral BID BM  . levothyroxine  50 mcg Oral QAC breakfast  . metoprolol tartrate  50 mg Oral BID  . pantoprazole  40 mg Oral Daily  . pravastatin  40 mg Oral Daily  . predniSONE  5 mg Oral Daily   Continuous Infusions: . cefTRIAXone (ROCEPHIN)  IV  Stopped (03/01/17 1153)  . [START ON 03/02/2017] vancomycin       LOS: 0 days    Time spent: 35 mins    Balen Woolum, MD [facility] Pager [phone]  If 7PM-7AM, please contact night-coverage [email or web address] Password [email or web address] 03/01/2017, 12:39 PM

## 2017-03-01 NOTE — Care Management Obs Status (Signed)
MEDICARE OBSERVATION STATUS NOTIFICATION   Patient Details  Name: Troy [name] MRN: [id number] Date of Birth: 1920/08/17   Medicare Observation Status Notification Given:  Yes    Elliot Cousin, RN 03/01/2017, 3:14 PM

## 2017-03-01 NOTE — Progress Notes (Signed)
ANTICOAGULATION CONSULT NOTE - Follow Up Consult  Pharmacy Consult for warfarin Indication: atrial fibrillation, Hx PE  No Known Allergies  Patient Measurements: Height: 5' 8.11" (173 cm) Weight: 149 lb 14.6 oz (68 kg) IBW/kg (Calculated) : 68.65  Vital Signs: Temp: 97.9 F (36.6 C) (10/07 0614) Temp Source: Oral (10/07 0614) BP: 132/65 (10/07 0614) Pulse Rate: 70 (10/07 0614)  Labs:  Recent Labs  02/28/17 0108 02/28/17 0725 03/01/17 0359  HGB 9.4*  --   --   HCT 31.1*  --   --   PLT 181  --   --   LABPROT 45.2* 45.8* 44.0*  INR 4.88* 4.96* 4.72*  CREATININE 2.36* 2.33* 2.25*    Estimated Creatinine Clearance: 19.3 mL/min (A) (by C-G formula based on SCr of 2.25 mg/dL (H)).   Medications:  Prescriptions Prior to Admission  Medication Sig Dispense Refill Last Dose  . aspirin 81 MG EC tablet Take 81 mg by mouth daily.     02/27/2017 at 1130  . feeding supplement, ENSURE ENLIVE, (ENSURE ENLIVE) LIQD Take 237 mLs by mouth 2 (two) times daily between meals. 237 mL 12 02/27/2017 at Unknown time  . furosemide (LASIX) 40 MG tablet Take 1 tablet (40 mg total) by mouth daily. PATIENT NEEDS TO CONTACT OFFICE FOR ADDITIONAL REFILLS 90 tablet 0 02/27/2017 at Unknown time  . levothyroxine (SYNTHROID, LEVOTHROID) 50 MCG tablet Take 50 mcg by mouth daily.  4 02/27/2017 at Unknown time  . metoprolol (LOPRESSOR) 50 MG tablet Take 1 tablet (50 mg total) by mouth 2 (two) times daily. 180 tablet 3 02/27/2017 at 2130  . nitroGLYCERIN (NITROSTAT) 0.4 MG SL tablet Place 0.4 mg under the tongue every 5 (five) minutes as needed.   unk  . pantoprazole (PROTONIX) 40 MG tablet TAKE 1 TABLET (40 MG TOTAL) BY MOUTH DAILY. 30 tablet 11 02/27/2017 at Unknown time  . pravastatin (PRAVACHOL) 40 MG tablet Take 40 mg by mouth daily.   02/27/2017 at Unknown time  . predniSONE (DELTASONE) 5 MG tablet Take 5 mg by mouth daily.     02/27/2017 at Unknown time  . ULORIC 40 MG tablet TAKE 1 TABLET BY MOUTH EVERY DAY 30  tablet 11 02/27/2017 at Unknown time  . warfarin (COUMADIN) 2.5 MG tablet Take 2.5 mg by mouth daily.    02/27/2017 at 2130   Scheduled:  . aspirin EC  81 mg Oral Daily  . febuxostat  40 mg Oral Daily  . feeding supplement (ENSURE ENLIVE)  237 mL Oral BID BM  . levothyroxine  50 mcg Oral QAC breakfast  . metoprolol tartrate  50 mg Oral BID  . pantoprazole  40 mg Oral Daily  . pravastatin  40 mg Oral Daily  . predniSONE  5 mg Oral Daily   PRN: acetaminophen **OR** acetaminophen, ondansetron **OR** ondansetron (ZOFRAN) IV  Assessment: 89 yoM with PMH prior PE, Afib on warfarin, MI, HTN, pulm HTN, admitted 10/6 with R arm cellulitis vs bursitis. Pharmacy to continue warfarin while admitted   Baseline INR supratherapeutic  Prior anticoagulation: warfarin 2.5 mg daily, LD 10/5  Significant events:  Today, 03/01/2017:  CBC: Hgb low but appears to be near baseline  INR remains supratherapeutic   Major drug interactions: none, on Vanc/Rocephin and ASA 81 mg  No bleeding issues per nursing  Eating 100% of meals  Goal of Therapy: INR 2-3  Plan:  Continue to hold warfarin  Daily INR  CBC at least weekly while on warfarin  Monitor for signs  of bleeding or thrombosis  Bernadene Person, PharmD, BCPS Pager: [phone] 03/01/2017, 11:29 AM

## 2017-03-02 LAB — CBC WITH DIFFERENTIAL/PLATELET
Basophils Absolute: 0 10*3/uL (ref 0.0–0.1)
Basophils Relative: 0 %
Eosinophils Absolute: 0.1 10*3/uL (ref 0.0–0.7)
Eosinophils Relative: 1 %
HEMATOCRIT: 29.2 % — AB (ref 39.0–52.0)
HEMOGLOBIN: 9 g/dL — AB (ref 13.0–17.0)
LYMPHS ABS: 1.1 10*3/uL (ref 0.7–4.0)
Lymphocytes Relative: 15 %
MCH: 23.6 pg — AB (ref 26.0–34.0)
MCHC: 30.8 g/dL (ref 30.0–36.0)
MCV: 76.6 fL — AB (ref 78.0–100.0)
Monocytes Absolute: 0.7 10*3/uL (ref 0.1–1.0)
Monocytes Relative: 9 %
NEUTROS PCT: 75 %
Neutro Abs: 5.7 10*3/uL (ref 1.7–7.7)
Platelets: 198 10*3/uL (ref 150–400)
RBC: 3.81 MIL/uL — AB (ref 4.22–5.81)
RDW: 18.2 % — ABNORMAL HIGH (ref 11.5–15.5)
WBC: 7.6 10*3/uL (ref 4.0–10.5)

## 2017-03-02 LAB — GLUCOSE, CAPILLARY
GLUCOSE-CAPILLARY: 108 mg/dL — AB (ref 65–99)
GLUCOSE-CAPILLARY: 161 mg/dL — AB (ref 65–99)
GLUCOSE-CAPILLARY: 127 mg/dL — AB (ref 65–99)

## 2017-03-02 LAB — BASIC METABOLIC PANEL
Anion gap: 10 (ref 5–15)
BUN: 67 mg/dL — ABNORMAL HIGH (ref 6–20)
CHLORIDE: 98 mmol/L — AB (ref 101–111)
CO2: 29 mmol/L (ref 22–32)
Calcium: 8.8 mg/dL — ABNORMAL LOW (ref 8.9–10.3)
Creatinine, Ser: 2.11 mg/dL — ABNORMAL HIGH (ref 0.61–1.24)
GFR calc non Af Amer: 25 mL/min — ABNORMAL LOW (ref >60)
GFR, EST AFRICAN AMERICAN: 29 mL/min — AB (ref >60)
Glucose, Bld: 113 mg/dL — ABNORMAL HIGH (ref 65–99)
Potassium: 4 mmol/L (ref 3.5–5.1)
SODIUM: 137 mmol/L (ref 135–145)

## 2017-03-02 LAB — PROTIME-INR
INR: 3.82
Prothrombin Time: 37.3 seconds — ABNORMAL HIGH (ref 11.4–15.2)

## 2017-03-02 MED ORDER — CEPHALEXIN 250 MG PO CAPS
250.0000 mg | ORAL_CAPSULE | Freq: Three times a day (TID) | ORAL | Status: DC
  Administered 2017-03-02 – 2017-03-03 (×3): 250 mg via ORAL
  Filled 2017-03-02 (×3): qty 1

## 2017-03-02 MED ORDER — DOXYCYCLINE HYCLATE 100 MG PO TABS
100.0000 mg | ORAL_TABLET | Freq: Two times a day (BID) | ORAL | Status: DC
  Administered 2017-03-02 – 2017-03-03 (×2): 100 mg via ORAL
  Filled 2017-03-02 (×2): qty 1

## 2017-03-02 NOTE — Progress Notes (Signed)
Initial Nutrition Assessment  INTERVENTION:   Continue Ensure Enlive po BID, each supplement provides 350 kcal and 20 grams of protein RD will continue to monitor for needs  NUTRITION DIAGNOSIS:   Unintentional weight loss related to  (advanced age) as evidenced by moderate depletions of muscle mass, percent weight loss.  GOAL:   Patient will meet greater than or equal to 90% of their needs  MONITOR:   PO intake, Supplement acceptance, Labs, Weight trends, I & O's  REASON FOR ASSESSMENT:   Malnutrition Screening Tool    ASSESSMENT:   83 y.o. male with medical history significant of chronic coumadin for prior PE and AF, NSTEMI EF 50-55%, mild to mod pulm HTN, HTN.  Patient presents to the ED with family with R arm erythema, swelling, pain.  Ongoing for past 2-3 days.  Patient has some memory difficulties and cant say when it exactly started but it wasn't there when family last visited 4 days ago.  Patient currently consuming 100% of meals and drinking Ensure supplements. Pt reports poor appetite for a few days PTA. Eating better now. Per chart review, pt has lost 17 lb since 3/22 (10% wt loss x 6.5 months, insignificant for time frame). Nutrition-Focused physical exam completed. Findings are no fat depletion, moderate muscle depletion, and no edema. Depletions are most likely related to advanced age.  Medications: Protonix tablet daily Labs reviewed: CBGs: 108-161 GFR: 25  Diet Order:  Diet Heart Room service appropriate? Yes; Fluid consistency: Thin  Skin:  Reviewed, no issues  Last BM:  10/5  Height:   Ht Readings from Last 1 Encounters:  02/28/17 5' 8.11" (1.73 m)    Weight:   Wt Readings from Last 1 Encounters:  02/28/17 149 lb 14.6 oz (68 kg)    Ideal Body Weight:  70 kg  BMI:  Body mass index is 22.72 kg/m.  Estimated Nutritional Needs:   Kcal:  1400-1600  Protein:  70-80g  Fluid:  1.6L/day  EDUCATION NEEDS:   No education needs identified at  this time  Tilda Franco, MS, RD, LDN Pager: [phone] After Hours Pager: [phone]

## 2017-03-02 NOTE — Evaluation (Signed)
Physical Therapy Evaluation Patient Details Name: Troy [name] MRN: [id number] DOB: [birth year] Today's Date: 03/02/2017   History of Present Illness  Troy [name] is a 83 y.o. male with medical history significant of chronic coumadin for prior PE and AF, NSTEMI EF 50-55%, mild to mod pulm HTN, HTN.  Patient presents to the ED with family with R arm erythema, swelling, pain.  Clinical Impression  Pt is at or close to baseline functioning and should be safe at home with daughter's available level of assist.. There are no further acute PT needs.  Will sign off at this time.     Follow Up Recommendations No PT follow up;Other (comment) (pt's daughter states he kick PT out before he gets started)    Equipment Recommendations  None recommended by PT    Recommendations for Other Services       Precautions / Restrictions Precautions Precautions: Fall      Mobility  Bed Mobility Overal bed mobility: Modified Independent                Transfers Overall transfer level: Modified independent                  Ambulation/Gait Ambulation/Gait assistance: Supervision Ambulation Distance (Feet): 200 Feet Assistive device: Rolling walker (2 wheeled) Gait Pattern/deviations: Step-through pattern   Gait velocity interpretation: Below normal speed for age/gender General Gait Details: generally steady and safe with use of the RW  Stairs            Wheelchair Mobility    Modified Rankin (Stroke Patients Only)       Balance Overall balance assessment: Needs assistance   Sitting balance-Leahy Scale: Good       Standing balance-Leahy Scale: Fair                               Pertinent Vitals/Pain Pain Assessment: Faces Faces Pain Scale: No hurt    Home Living Family/patient expects to be discharged to:: Private residence Living Arrangements: Alone Available Help at Discharge: Family Type of Home: House Home Access: Stairs to  enter Entrance Stairs-Rails: Can reach both;Left;Right Entrance Stairs-Number of Steps: 2 Home Layout: One level Home Equipment: Walker - 2 wheels;Cane - single point;Tub bench      Prior Function Level of Independence: Independent with assistive device(s)         Comments: walks with SPC, family can assist prn not 24*     Hand Dominance        Extremity/Trunk Assessment   Upper Extremity Assessment Upper Extremity Assessment: Defer to OT evaluation    Lower Extremity Assessment Lower Extremity Assessment: Overall WFL for tasks assessed (proximal weakness bil)       Communication   Communication: No difficulties  Cognition Arousal/Alertness: Awake/alert Behavior During Therapy: WFL for tasks assessed/performed Overall Cognitive Status: Within Functional Limits for tasks assessed                                        General Comments General comments (skin integrity, edema, etc.): pt's daughter stated that pt looks to be at baseline level of mobility    Exercises     Assessment/Plan    PT Assessment Patent does not need any further PT services  PT Problem List Decreased strength;Decreased activity tolerance;Decreased mobility;Decreased knowledge of use of DME  PT Treatment Interventions      PT Goals (Current goals can be found in the Care Plan section)  Acute Rehab PT Goals Patient Stated Goal: get home PT Goal Formulation: With patient    Frequency     Barriers to discharge        Co-evaluation               AM-PAC PT "6 Clicks" Daily Activity  Outcome Measure Difficulty turning over in bed (including adjusting bedclothes, sheets and blankets)?: None Difficulty moving from lying on back to sitting on the side of the bed? : None Difficulty sitting down on and standing up from a chair with arms (e.g., wheelchair, bedside commode, etc,.)?: None Help needed moving to and from a bed to chair (including a wheelchair)?: A  Little Help needed walking in hospital room?: A Little Help needed climbing 3-5 steps with a railing? : A Little 6 Click Score: 21    End of Session   Activity Tolerance: Patient tolerated treatment well Patient left: in chair;with call bell/phone within reach;with family/visitor present Nurse Communication: Mobility status PT Visit Diagnosis: Unsteadiness on feet (R26.81)    Time: 1610-9604 PT Time Calculation (min) (ACUTE ONLY): 20 min   Charges:   PT Evaluation $PT Eval Moderate Complexity: 1 Mod     PT G Codes:        2017-03-29  B and E Bing, PT [phone]  (pager)  Eliseo Gum Anuradha Chabot 2017-03-29, 5:19 PM

## 2017-03-02 NOTE — Progress Notes (Signed)
ANTICOAGULATION CONSULT NOTE - Follow Up Consult  Pharmacy Consult for warfarin Indication: atrial fibrillation, Hx PE  No Known Allergies  Patient Measurements: Height: 5' 8.11" (173 cm) Weight: 149 lb 14.6 oz (68 kg) IBW/kg (Calculated) : 68.65  Vital Signs: Temp: 97.7 F (36.5 C) (10/08 0634) Temp Source: Oral (10/08 0634) BP: 138/80 (10/08 0756) Pulse Rate: 60 (10/08 0756)  Labs:  Recent Labs  02/28/17 0108 02/28/17 0725 03/01/17 0359 03/02/17 0354  HGB 9.4*  --   --  9.0*  HCT 31.1*  --   --  29.2*  PLT 181  --   --  198  LABPROT 45.2* 45.8* 44.0* 37.3*  INR 4.88* 4.96* 4.72* 3.82  CREATININE 2.36* 2.33* 2.25* 2.11*   Estimated Creatinine Clearance: 20.6 mL/min (A) (by C-G formula based on SCr of 2.11 mg/dL (H)).  Medications:  Prescriptions Prior to Admission  Medication Sig Dispense Refill Last Dose  . aspirin 81 MG EC tablet Take 81 mg by mouth daily.     02/27/2017 at 1130  . feeding supplement, ENSURE ENLIVE, (ENSURE ENLIVE) LIQD Take 237 mLs by mouth 2 (two) times daily between meals. 237 mL 12 02/27/2017 at Unknown time  . furosemide (LASIX) 40 MG tablet Take 1 tablet (40 mg total) by mouth daily. PATIENT NEEDS TO CONTACT OFFICE FOR ADDITIONAL REFILLS 90 tablet 0 02/27/2017 at Unknown time  . levothyroxine (SYNTHROID, LEVOTHROID) 50 MCG tablet Take 50 mcg by mouth daily.  4 02/27/2017 at Unknown time  . metoprolol (LOPRESSOR) 50 MG tablet Take 1 tablet (50 mg total) by mouth 2 (two) times daily. 180 tablet 3 02/27/2017 at 2130  . nitroGLYCERIN (NITROSTAT) 0.4 MG SL tablet Place 0.4 mg under the tongue every 5 (five) minutes as needed.   unk  . pantoprazole (PROTONIX) 40 MG tablet TAKE 1 TABLET (40 MG TOTAL) BY MOUTH DAILY. 30 tablet 11 02/27/2017 at Unknown time  . pravastatin (PRAVACHOL) 40 MG tablet Take 40 mg by mouth daily.   02/27/2017 at Unknown time  . predniSONE (DELTASONE) 5 MG tablet Take 5 mg by mouth daily.     02/27/2017 at Unknown time  . ULORIC 40 MG  tablet TAKE 1 TABLET BY MOUTH EVERY DAY 30 tablet 11 02/27/2017 at Unknown time  . warfarin (COUMADIN) 2.5 MG tablet Take 2.5 mg by mouth daily.    02/27/2017 at 2130   Scheduled:  . aspirin EC  81 mg Oral Daily  . febuxostat  40 mg Oral Daily  . feeding supplement (ENSURE ENLIVE)  237 mL Oral BID BM  . levothyroxine  50 mcg Oral QAC breakfast  . metoprolol tartrate  50 mg Oral BID  . pantoprazole  40 mg Oral Daily  . pravastatin  40 mg Oral Daily  . predniSONE  5 mg Oral Daily   PRN: acetaminophen **OR** acetaminophen, ondansetron **OR** ondansetron (ZOFRAN) IV  Assessment: 67 yoM with PMH prior PE, Afib on warfarin, MI, HTN, pulm HTN, admitted 10/6 with R arm cellulitis vs bursitis. Pharmacy to continue warfarin while admitted   Baseline INR supratherapeutic  Prior anticoagulation: warfarin 2.5 mg daily, LD 10/5  Significant events:  Today, 03/02/2017:  CBC: Hgb low but appears to be near baseline  INR remains supratherapeutic   Major drug interactions: none, on Vanc/Rocephin and ASA 81 mg  No bleeding issues per nursing  Eating 100% of meals  Goal of Therapy: INR 2-3  Plan:  Continue to hold warfarin  Daily INR  CBC at least weekly while  on warfarin  Monitor for signs of bleeding or thrombosis  Otho Bellows PharmD Pager [phone] 03/02/2017, 8:41 AM

## 2017-03-02 NOTE — Progress Notes (Signed)
PROGRESS NOTE    Troy [name]  ZOX:[id number] DOB: 05/04/1921 DOA: 02/28/2017 PCP: Pearson Grippe, MD    Brief Narrative:  Troy [name] is a 82 y.o. male with medical history significant of chronic coumadin for prior PE and AF, NSTEMI EF 50-55%, mild to mod pulm HTN, HTN.  Patient presents to the ED with family with R arm erythema, swelling, pain.  Ongoing for past 2-3 days.  Patient has some memory difficulties and cant say when it exactly started but it wasn't there when family last visited 4 days ago.  Some pain with this but not severe.  Most painful inner elbow.  No fevers, but decreased PO intake past couple of days.   ED Course: Tm 99, WBC 18k, INR 4.88.   Assessment & Plan:   Principal Problem:   Cellulitis of right arm Active Problems:   Permanent atrial fibrillation (HCC)   CKD (chronic kidney disease) stage 4, GFR 15-29 ml/min (HCC)   Acute lower UTI   Iron deficiency anemia  #1 right upper extremity cellulitis Patient presented with a right upper extremity cellulitis. CT right upper extremity negative for any abscess formation. Cellulitis improving. Will transition from IV antibiotics to oral doxycycline and oral Keflex. Will likely treat for total of 7-10 days.   #2 probable acute UTI vs bacteruria Urinalysis consistent with a UTI. Urine cultures with less than 10,000 colonies. Likely insignificant growth. Discontinue IV Rocephin.   #3 chronic kidney disease stage IV Stable. Currently at baseline.  #5 paroxysmal atrial fibrillation Currently rate controlled on Toprol. INR supratherapeutic. Coumadin on hold. Coumadin per pharmacy.  #6 chronic Foley catheter Status post Foley catheter exchange per RN 02/28/2017. Resumed home regimen Flomax as 0.4 mg daily.  #7 iron deficiency anemia/anemia of chronic disease Anemia panel consistent with a iron deficiency anemia. Iron level at 16. Ferritin is 74. Folate at 35. B-12 levels at 700. Status post a dose of IV  Feraheme 1. Outpatient follow-up with nephrology.    DVT prophylaxis: Supratherapeutic INR. Was on Coumadin prior to admission. Coumadin per pharmacy. Code Status: DO NOT RESUSCITATE Family Communication: Updated patient. No family at bedside. Disposition Plan: Likely back home once right upper extremity cellulitis has improved and patient on oral antibiotics likely tomorrow.   Consultants:   None  Procedures:   CT right upper extremity 02/28/2017  Antimicrobials:   IV vancomycin 02/28/2017>>>>> 03/02/2017  IV Rocephin 03/01/2017>>>>>> 03/02/2017   Subjective: Patient in bed. Patient feels right upper extremity cellulitis is slowly improving. Denies any chest pain or shortness of breath. Denies any bleeding.  Objective: Vitals:   03/01/17 1327 03/01/17 2211 03/02/17 0634 03/02/17 0756  BP: 123/73 111/62 127/74 138/80  Pulse: 69 74 71 60  Resp: Temp: 97.7 F (36.5 C) 97.8 F (36.6 C) 97.7 F (36.5 C)   TempSrc: Oral Oral Oral   SpO2: 98% 97% 98%   Weight:      Height:        Intake/Output Summary (Last 24 hours) at 03/02/17 1323 Last data filed at 03/02/17 1032  Gross per 24 hour  Intake             1117 ml  Output              900 ml  Net              217 ml   Filed Weights   02/27/17 2354 02/28/17 0646  Weight: 68 kg (150 lb)  68 kg (149 lb 14.6 oz)    Examination:  General exam: NAD Respiratory system: Clear to auscultation Bilaterally. No wheezes, no crackles, no rhonchi.Marland Kitchen Respiratory effort normal. Cardiovascular system: S1 & S2 heard, RRR. No JVD, murmurs, rubs, gallops or clicks. No pedal edema. Gastrointestinal system: Abdomen is soft, nontender, nondistended, positive bowel sounds. No hepatosplenomegaly.  Central nervous system: Alert and oriented. No focal neurological deficits. Extremities: RUE with less erythema, warmth, edema. Skin: RUE cellulitis Psychiatry: Judgement and insight appear normal. Mood & affect appropriate.      Data Reviewed: I have personally reviewed following labs and imaging studies  CBC:  Recent Labs Lab 02/28/17 0108 03/02/17 0354  WBC 18.2* 7.6  NEUTROABS 15.7* 5.7  HGB 9.4* 9.0*  HCT 31.1* 29.2*  MCV 77.4* 76.6*  PLT 181 198   Basic Metabolic Panel:  Recent Labs Lab 02/28/17 0108 02/28/17 0725 03/01/17 0359 03/02/17 0354  NA 136 137 136 137  K 3.9 3.6 3.7 4.0  CL 96* 98* 98* 98*  CO2 GLUCOSE 168* 102* 124* 113*  BUN 67* 59* 61* 67*  CREATININE 2.36* 2.33* 2.25* 2.11*  CALCIUM 9.0 8.9 8.8* 8.8*   GFR: Estimated Creatinine Clearance: 20.6 mL/min (A) (by C-G formula based on SCr of 2.11 mg/dL (H)). Liver Function Tests: No results for input(s): AST, ALT, ALKPHOS, BILITOT, PROT, ALBUMIN in the last 168 hours. No results for input(s): LIPASE, AMYLASE in the last 168 hours. No results for input(s): AMMONIA in the last 168 hours. Coagulation Profile:  Recent Labs Lab 02/28/17 0108 02/28/17 0725 03/01/17 0359 03/02/17 0354  INR 4.88* 4.96* 4.72* 3.82   Cardiac Enzymes: No results for input(s): CKTOTAL, CKMB, CKMBINDEX, TROPONINI in the last 168 hours. BNP (last 3 results) No results for input(s): PROBNP in the last 8760 hours. HbA1C: No results for input(s): HGBA1C in the last 72 hours. CBG:  Recent Labs Lab 02/28/17 0016 02/28/17 2058 03/01/17 2023 03/02/17 0748 03/02/17 1240  GLUCAP 183* 184* 270* 108* 161*   Lipid Profile: No results for input(s): CHOL, HDL, LDLCALC, TRIG, CHOLHDL, LDLDIRECT in the last 72 hours. Thyroid Function Tests: No results for input(s): TSH, T4TOTAL, FREET4, T3FREE, THYROIDAB in the last 72 hours. Anemia Panel:  Recent Labs  03/01/17 0841  VITAMINB12 700  FOLATE 35.0  FERRITIN 74  TIBC 335  IRON 16*   Sepsis Labs:  Recent Labs Lab 02/28/17 0135 03/01/17 0849  LATICACIDVEN 2.38* 1.3    Recent Results (from the past 240 hour(s))  Culture, blood (routine x 2)     Status: None (Preliminary  result)   Collection Time: 02/28/17  7:25 AM  Result Value Ref Range Status   Specimen Description BLOOD LEFT ANTECUBITAL  Final   Special Requests IN PEDIATRIC BOTTLE Blood Culture adequate volume  Final   Culture   Final    NO GROWTH 1 DAY Performed at [facility], [address], [place], [place] [address]    Report Status PENDING  Incomplete  Culture, blood (routine x 2)     Status: None (Preliminary result)   Collection Time: 02/28/17  7:30 AM  Result Value Ref Range Status   Specimen Description BLOOD BLOOD LEFT HAND  Final   Special Requests   Final    BOTTLES DRAWN AEROBIC ONLY Blood Culture adequate volume   Culture   Final    NO GROWTH 1 DAY Performed at [facility], [address] [place], [place] [address]  Report Status PENDING  Incomplete  Culture, Urine     Status: Abnormal   Collection Time: 02/28/17  1:59 PM  Result Value Ref Range Status   Specimen Description URINE, CATHETERIZED  Final   Special Requests NONE  Final   Culture (A)  Final    <10,000 COLONIES/mL INSIGNIFICANT GROWTH Performed at [facility], [address] [place], [place] [address]    Report Status 03/01/2017 FINAL  Final         Radiology Studies: No results found.      Scheduled Meds: . aspirin EC  81 mg Oral Daily  . febuxostat  40 mg Oral Daily  . feeding supplement (ENSURE ENLIVE)  237 mL Oral BID BM  . levothyroxine  50 mcg Oral QAC breakfast  . metoprolol tartrate  50 mg Oral BID  . pantoprazole  40 mg Oral Daily  . pravastatin  40 mg Oral Daily  . predniSONE  5 mg Oral Daily   Continuous Infusions: . cefTRIAXone (ROCEPHIN)  IV Stopped (03/02/17 1120)  . vancomycin Stopped (03/02/17 0615)     LOS: 1 day    Time spent: 35 mins    THOMPSON,DANIEL, MD [facility] Pager [phone]  If 7PM-7AM, please contact night-coverage [email or web address] Password [email or web address] 03/02/2017, 1:23 PM

## 2017-03-02 NOTE — Evaluation (Signed)
Occupational Therapy Evaluation Patient Details Name: Troy [name] MRN: [id number] DOB: [birth year] Today's Date: 03/02/2017    History of Present Illness Troy [name] is a 83 y.o. male with medical history significant of chronic coumadin for prior PE and AF, NSTEMI EF 50-55%, mild to mod pulm HTN, HTN.  Patient presents to the ED with family with R arm erythema, swelling, pain.   Clinical Impression   Pt admitted with R arm edema. Pt currently with functional limitations due to the deficits listed below (see OT Problem List).  Pt will benefit from skilled OT to increase their safety and independence with ADL and functional mobility for ADL to facilitate discharge to venue listed below.     Follow Up Recommendations  Home health OT;Supervision - Intermittent;No OT follow up    Equipment Recommendations  None recommended by OT    Recommendations for Other Services       Precautions / Restrictions Precautions Precautions: Fall Restrictions Weight Bearing Restrictions: No      Mobility Bed Mobility               General bed mobility comments: pt seemed to be moving well in bed but refused to sit up  Transfers                 General transfer comment: refused    Balance                                           ADL either performed or assessed with clinical judgement   ADL Overall ADL's : Needs assistance/impaired                                       General ADL Comments: pt declined ADL activity after OT assessed his RUE     Vision Baseline Vision/History: Wears glasses Patient Visual Report: No change from baseline       Perception     Praxis      Pertinent Vitals/Pain Pain Assessment: No/denies pain     Hand Dominance     Extremity/Trunk Assessment Upper Extremity Assessment Upper Extremity Assessment: Generalized weakness (edema noted in pts RUE upper arm area)           Communication  Communication Communication: No difficulties   Cognition Arousal/Alertness: Awake/alert Behavior During Therapy: WFL for tasks assessed/performed Overall Cognitive Status: Within Functional Limits for tasks assessed                                     General Comments       Exercises General Exercises - Upper Extremity Shoulder Flexion: AROM;Right   Shoulder Instructions      Home Living Family/patient expects to be discharged to:: Private residence Living Arrangements: Alone Available Help at Discharge: Family Type of Home: House Home Access: Stairs to enter [employer] of Steps: 2 Entrance Stairs-Rails: Can reach both;Left;Right Home Layout: One level     Bathroom Shower/Tub: Tub/shower unit;Walk-in shower   Bathroom Toilet: Standard     Home Equipment: Environmental consultant - 2 wheels;Cane - single point;Tub bench          Prior Functioning/Environment Level of Independence: Independent with assistive device(s)  Comments: walks with SPC, family can assist prn not 24*        OT Problem List: Decreased strength;Decreased activity tolerance;Decreased knowledge of use of DME or AE;Decreased safety awareness      OT Treatment/Interventions: Self-care/ADL training;Patient/family education;DME and/or AE instruction    OT Goals(Current goals can be found in the care plan section) Acute Rehab OT Goals Patient Stated Goal: get home OT Goal Formulation: With patient Time For Goal Achievement: 03/16/17 Potential to Achieve Goals: Good ADL Goals Pt Will Perform Lower Body Dressing: with modified independence;sit to/from stand Pt Will Transfer to Toilet: with modified independence;regular height toilet;ambulating Pt Will Perform Toileting - Clothing Manipulation and hygiene: with modified independence;sit to/from stand Pt/caregiver will Perform Home Exercise Program: Right Upper extremity (to decrease edema and maintain mobility)  OT Frequency: Min  2X/week   Barriers to D/C:            Co-evaluation              AM-PAC PT "6 Clicks" Daily Activity     Outcome Measure                 End of Session    Activity Tolerance: Other (comment) (limited due to lack of participation) Patient left: in bed;with call bell/phone within reach;with bed alarm set  OT Visit Diagnosis: Repeated falls (R29.6);History of falling (Z91.81);Muscle weakness (generalized) (M62.81)                Time: 1610-9604 OT Time Calculation (min): 16 min Charges:  OT General Charges $OT Visit: 1 Visit OT Evaluation $OT Eval Moderate Complexity: 1 Mod G-Codes:     Lise Auer, [place] [phone]  Einar Crow D 03/02/2017, 11:44 AM

## 2017-03-03 LAB — CBC
HEMATOCRIT: 30.8 % — AB (ref 39.0–52.0)
Hemoglobin: 9.1 g/dL — ABNORMAL LOW (ref 13.0–17.0)
MCH: 22.8 pg — ABNORMAL LOW (ref 26.0–34.0)
MCHC: 29.5 g/dL — AB (ref 30.0–36.0)
MCV: 77 fL — AB (ref 78.0–100.0)
PLATELETS: 217 10*3/uL (ref 150–400)
RBC: 4 MIL/uL — ABNORMAL LOW (ref 4.22–5.81)
RDW: 17.9 % — AB (ref 11.5–15.5)
WBC: 6 10*3/uL (ref 4.0–10.5)

## 2017-03-03 LAB — BASIC METABOLIC PANEL
Anion gap: 8 (ref 5–15)
BUN: 64 mg/dL — AB (ref 6–20)
CHLORIDE: 102 mmol/L (ref 101–111)
CO2: 30 mmol/L (ref 22–32)
CREATININE: 1.85 mg/dL — AB (ref 0.61–1.24)
Calcium: 9.2 mg/dL (ref 8.9–10.3)
GFR calc Af Amer: 34 mL/min — ABNORMAL LOW (ref >60)
GFR, EST NON AFRICAN AMERICAN: 30 mL/min — AB (ref >60)
GLUCOSE: 110 mg/dL — AB (ref 65–99)
Potassium: 4.1 mmol/L (ref 3.5–5.1)
Sodium: 140 mmol/L (ref 135–145)

## 2017-03-03 LAB — PROTIME-INR
INR: 3.12
Prothrombin Time: 31.9 seconds — ABNORMAL HIGH (ref 11.4–15.2)

## 2017-03-03 LAB — GLUCOSE, CAPILLARY
Glucose-Capillary: 96 mg/dL (ref 65–99)
Glucose-Capillary: 123 mg/dL — ABNORMAL HIGH (ref 65–99)

## 2017-03-03 MED ORDER — WARFARIN SODIUM 2.5 MG PO TABS: 2.5000 mg | ORAL_TABLET | Freq: Every day | ORAL | Status: AC

## 2017-03-03 MED ORDER — ONDANSETRON HCL 4 MG PO TABS: 4.0000 mg | ORAL_TABLET | Freq: Four times a day (QID) | ORAL | 0 refills | Status: AC | PRN

## 2017-03-03 MED ORDER — DOXYCYCLINE HYCLATE 100 MG PO TABS: 100.0000 mg | ORAL_TABLET | Freq: Two times a day (BID) | ORAL | 0 refills | Status: AC

## 2017-03-03 MED ORDER — FUROSEMIDE 40 MG PO TABS: 40.0000 mg | ORAL_TABLET | Freq: Every day | ORAL | 0 refills | Status: AC

## 2017-03-03 MED ORDER — CEPHALEXIN 250 MG PO CAPS: 250.0000 mg | ORAL_CAPSULE | Freq: Three times a day (TID) | ORAL | 0 refills | Status: AC

## 2017-03-03 NOTE — Discharge Summary (Signed)
Physician Discharge Summary  Troy [name] ZOX:[id number] DOB: 08-Jun-1920 DOA: 02/28/2017  PCP: Pearson Grippe, MD  Admit date: 02/28/2017 Discharge date: 03/03/2017  Time spent: 60 minutes  Recommendations for Outpatient Follow-up:  1. Patient will need home health RN to check INR on Friday, 03/06/2017. 2. Follow-up with Pearson Grippe, MD in 2 weeks. On follow-up patient will need a basic metabolic profile done to follow-up on electrolytes and renal function. Right upper extremity cellulitis related to be reassessed. 3. Follow up with [facility] in 2 weeks to follow-up on chronic Foley catheter,?? Voiding trial. 4. Follow-up with Dr.Coladonato as scheduled.   Discharge Diagnoses:  Principal Problem:   Cellulitis of right arm Active Problems:   Permanent atrial fibrillation (HCC)   CKD (chronic kidney disease) stage 4, GFR 15-29 ml/min (HCC)   Acute lower UTI   Iron deficiency anemia   Discharge Condition: Stable and improved  Diet recommendation: Heart healthy  Filed Weights   02/27/17 2354 02/28/17 0646  Weight: 68 kg (150 lb) 68 kg (149 lb 14.6 oz)    History of present illness:  Per Dr.Gardner  Troy [name] is a 83 y.o. male with medical history significant of chronic coumadin for prior PE and AF, NSTEMI EF 50-55%, mild to mod pulm HTN, HTN.  Patient presented to the ED with family with R arm erythema, swelling, pain.  Ongoing for past 2-3 days.  Patient has some memory difficulties and cant say when it exactly started but it wasn't there when family last visited 4 days ago.  Some pain with this but not severe.  Most painful inner elbow.  No fevers, but decreased PO intake past couple of days.  ED Course: Tm 99, WBC 18k, INR 4.88.  Hospital Course:  #1 right upper extremity cellulitis Patient presented with a right upper extremity cellulitis. CT right upper extremity negative for any abscess formation. Patient was placed initially empirically on IV vancomycin and IV  Rocephin. Upper extremity was elevated. Patient improved clinically and subsequently transitioned to oral doxycycline and oral Keflex. Patient was discharged home on 5 more days of oral doxycycline and Keflex to complete a seven-day course of antibiotic treatment. Outpatient follow-up.   #2 probable acute UTI vs bacteruria Urinalysis consistent with a UTI. Urine cultures with less than 10,000 colonies. Likely insignificant growth.   #3 chronic kidney disease stage IV Stable. Remained at baseline throughout the hospitalization.   #5 paroxysmal atrial fibrillation Patient remained rate controlled on Toprol. Patient's INR was supratherapeutic and a such Coumadin was held throughout the hospitalization will be resumed on discharge on 03/04/2017. INR on admission was 4.88 and had trended down to 3.12 by day of discharge. Patient will need INR checked on Friday, 03/06/2017.   #6 chronic Foley catheter Status post Foley catheter exchange per RN 02/28/2017. Resumed home regimen Flomax as 0.4 mg daily. Outpatient follow-up with [facility].  #7 iron deficiency anemia/anemia of chronic disease Anemia panel consistent with a iron deficiency anemia. Iron level at 16. Ferritin is 74. Folate at 35. B-12 levels at 700. Status post a dose of IV Feraheme 1. Outpatient follow-up with nephrology.  Procedures:  CT right upper extremity 02/28/2017  Consultations:  None  Discharge Exam: Vitals:   03/03/17 0423 03/03/17 1440  BP: 133/69 111/66  Pulse: 60 (!) 58  Resp: 16 16  Temp: 97.6 F (36.4 C) (!) 97.3 F (36.3 C)  SpO2: 98% 98%    General: NAD Cardiovascular: RRR Respiratory: CTAB  Discharge Instructions  Discharge Instructions    Diet - low sodium heart healthy    Complete by:  As directed    Increase activity slowly    Complete by:  As directed      Current Discharge Medication List    START taking these medications   Details  cephALEXin (KEFLEX) 250 MG capsule Take  1 capsule (250 mg total) by mouth every 8 (eight) hours. Qty: 15 capsule, Refills: 0    doxycycline (VIBRA-TABS) 100 MG tablet Take 1 tablet (100 mg total) by mouth every 12 (twelve) hours. Qty: 10 tablet, Refills: 0    ondansetron (ZOFRAN) 4 MG tablet Take 1 tablet (4 mg total) by mouth every 6 (six) hours as needed for nausea. Qty: 20 tablet, Refills: 0      CONTINUE these medications which have CHANGED   Details  furosemide (LASIX) 40 MG tablet Take 1 tablet (40 mg total) by mouth daily. PATIENT NEEDS TO CONTACT OFFICE FOR ADDITIONAL REFILLS Qty: 30 tablet, Refills: 0    warfarin (COUMADIN) 2.5 MG tablet Take 1 tablet (2.5 mg total) by mouth daily.      CONTINUE these medications which have NOT CHANGED   Details  aspirin 81 MG EC tablet Take 81 mg by mouth daily.      feeding supplement, ENSURE ENLIVE, (ENSURE ENLIVE) LIQD Take 237 mLs by mouth 2 (two) times daily between meals. Qty: 237 mL, Refills: 12    levothyroxine (SYNTHROID, LEVOTHROID) 50 MCG tablet Take 50 mcg by mouth daily. Refills: 4    metoprolol (LOPRESSOR) 50 MG tablet Take 1 tablet (50 mg total) by mouth 2 (two) times daily. Qty: 180 tablet, Refills: 3    nitroGLYCERIN (NITROSTAT) 0.4 MG SL tablet Place 0.4 mg under the tongue every 5 (five) minutes as needed.    pantoprazole (PROTONIX) 40 MG tablet TAKE 1 TABLET (40 MG TOTAL) BY MOUTH DAILY. Qty: 30 tablet, Refills: 11    pravastatin (PRAVACHOL) 40 MG tablet Take 40 mg by mouth daily.    predniSONE (DELTASONE) 5 MG tablet Take 5 mg by mouth daily.      ULORIC 40 MG tablet TAKE 1 TABLET BY MOUTH EVERY DAY Qty: 30 tablet, Refills: 11       No Known Allergies Follow-up Information    [facility] Follow up.   Specialty:  Home Health Services Why:  local office [phone] Home Health RN -agency will call to arrange visit Contact information: [address] [place] [address] [phone]        [facility]  Follow up.   Why:  INR check on Friday 03/06/2017       Pearson Grippe, MD. Schedule an appointment as soon as possible for a visit in 2 week(s).   Specialty:  Internal Medicine Why:  f/u in 2-3 weeks. Contact information: [address] [place] [address] [place] [address] [phone]        [facility]. Schedule an appointment as soon as possible for a visit in 2 week(s).   Contact information: [address] [place] [address] [phone]        Terrial Rhodes, MD Follow up.   Specialty:  Nephrology Why:  f/u as scheduled. Contact information: [address] [place] [address] [phone]            The results of significant diagnostics from this hospitalization (including imaging, microbiology, ancillary and laboratory) are listed below for reference.  Significant Diagnostic Studies: Ct Extrem Up Entire Arm R Wo/cm  Result Date: 02/28/2017 CLINICAL DATA:  Redness in the right arm. History of blood clots. Weak pulse. EXAM: CT OF THE UPPER RIGHT EXTREMITY WITHOUT CONTRAST TECHNIQUE: Multidetector CT imaging of the upper right extremity was performed according to the standard protocol. COMPARISON:  None. FINDINGS: Bones/Joint/Cartilage Degenerative changes in the glenohumeral joint with osteophytes on the glenoid and humeral surfaces. Old appearing ossicles adjacent to the greater tuberosity may represent tendinous calcifications or old avulsion fragments. Mild degenerative changes in the elbow. Old ununited ossicles over the olecranon process. No acute fracture is identified. No destructive or expansile bone lesions. Ligaments Suboptimally assessed by CT. Muscles and Tendons No intramuscular mass or infiltration.  Fat planes are distinct. Soft tissues No significant soft tissue infiltration is identified. No loculated fluid collections. Mild skin thickening suggested along the posterior aspect of the distal humerus and forearm. This  could indicate cellulitis or edema. Vascular calcifications in the tracheal, radial, and ulnar artery's. MRI would be more sensitive for evaluation of soft tissues if clinically indicated. IMPRESSION: 1. No acute bony abnormalities. Degenerative changes in the glenohumeral and elbow joints. 2. Skin thickening suggested along the posterior aspect of the distal humerus and forearm which could indicate cellulitis or edema. No discrete abscess identified. 3. Vascular calcifications. Electronically Signed   By: Burman Nieves M.D.   On: 02/28/2017 04:52    Microbiology: Recent Results (from the past 240 hour(s))  Culture, blood (routine x 2)     Status: None (Preliminary result)   Collection Time: 02/28/17  7:25 AM  Result Value Ref Range Status   Specimen Description BLOOD LEFT ANTECUBITAL  Final   Special Requests IN PEDIATRIC BOTTLE Blood Culture adequate volume  Final   Culture   Final    NO GROWTH 3 DAYS Performed at [facility], [address], [place], [place] [address]    Report Status PENDING  Incomplete  Culture, blood (routine x 2)     Status: None (Preliminary result)   Collection Time: 02/28/17  7:30 AM  Result Value Ref Range Status   Specimen Description BLOOD BLOOD LEFT HAND  Final   Special Requests   Final    BOTTLES DRAWN AEROBIC ONLY Blood Culture adequate volume   Culture   Final    NO GROWTH 3 DAYS Performed at [facility], [address] [place], [place] [address]    Report Status PENDING  Incomplete  Culture, Urine     Status: Abnormal   Collection Time: 02/28/17  1:59 PM  Result Value Ref Range Status   Specimen Description URINE, CATHETERIZED  Final   Special Requests NONE  Final   Culture (A)  Final    <10,000 COLONIES/mL INSIGNIFICANT GROWTH Performed at [facility], [address], [place], [place] [address]    Report Status 03/01/2017 FINAL  Final     Labs: Basic Metabolic Panel:  Recent Labs Lab 02/28/17 0108 02/28/17 0725  03/01/17 0359 03/02/17 0354 03/03/17 0415  NA 136 137 136 137 140  K 3.9 3.6 3.7 4.0 4.1  CL 96* 98* 98* 98* 102  CO2 GLUCOSE 168* 102* 124* 113* 110*  BUN 67* 59* 61* 67* 64*  CREATININE 2.36* 2.33* 2.25* 2.11* 1.85*  CALCIUM 9.0 8.9 8.8* 8.8* 9.2   Liver Function Tests: No results for input(s): AST, ALT, ALKPHOS, BILITOT, PROT, ALBUMIN in the last 168 hours. No results  for input(s): LIPASE, AMYLASE in the last 168 hours. No results for input(s): AMMONIA in the last 168 hours. CBC:  Recent Labs Lab 02/28/17 0108 03/02/17 0354 03/03/17 0415  WBC 18.2* 7.6 6.0  NEUTROABS 15.7* 5.7  --   HGB 9.4* 9.0* 9.1*  HCT 31.1* 29.2* 30.8*  MCV 77.4* 76.6* 77.0*  PLT 181 198 217   Cardiac Enzymes: No results for input(s): CKTOTAL, CKMB, CKMBINDEX, TROPONINI in the last 168 hours. BNP: BNP (last 3 results) No results for input(s): BNP in the last 8760 hours.  ProBNP (last 3 results) No results for input(s): PROBNP in the last 8760 hours.  CBG:  Recent Labs Lab 03/02/17 0748 03/02/17 1240 03/02/17 2208 03/03/17 0735 03/03/17 1203  GLUCAP 108* 161* 127* 96 123*       Signed:  THOMPSON,DANIEL MD.  [facility] 03/03/2017, 3:58 PM

## 2017-03-03 NOTE — Progress Notes (Signed)
Occupational Therapy Treatment and DC Patient Details Name: BREYTON VANSCYOC MRN: [id number] DOB: 11-13-1920 Today's Date: 03/03/2017    History of present illness KARTEL WOLBERT is a 83 y.o. male with medical history significant of chronic coumadin for prior PE and AF, NSTEMI EF 50-55%, mild to mod pulm HTN, HTN.  Patient presents to the ED with family with R arm erythema, swelling, pain.   OT comments  Goals met. Pt mod I with ADL activity   Follow Up Recommendations  Home health OT;Supervision - Intermittent;No OT follow up    Equipment Recommendations  None recommended by OT    Recommendations for Other Services      Precautions / Restrictions         Mobility Bed Mobility Overal bed mobility: Modified Independent                Transfers Overall transfer level: Modified independent                        ADL either performed or assessed with clinical judgement   ADL Overall ADL's : Modified independent                                       General ADL Comments: pt mod I with ADL activity this visit. LUE with decrease edema this day.  Pt aware of HEP and keepiing LUE elevated     Vision Baseline Vision/History: Wears glasses Patient Visual Report: No change from baseline            Cognition Arousal/Alertness: Awake/alert Behavior During Therapy: WFL for tasks assessed/performed Overall Cognitive Status: Within Functional Limits for tasks assessed                                                General Comments  instructed to maintain LUE elevation and perform LUE exercises as instucted    Pertinent Vitals/ Pain       Faces Pain Scale: No hurt  Home Living Family/patient expects to be discharged to:: Private residence Living Arrangements: Alone Available Help at Discharge: Family Type of Home: House Home Access: Stairs to enter Technical brewer of Steps: 2 Entrance Stairs-Rails: Can reach  both;Left;Right Home Layout: One level     Bathroom Shower/Tub: Tub/shower unit;Walk-in shower   Bathroom Toilet: Standard     Home Equipment: Environmental consultant - 2 wheels;Cane - single point;Tub bench          Prior Functioning/Environment Level of Independence: Independent with assistive device(s)        Comments: walks with SPC, family can assist prn not 24*   Frequency  Min 2X/week        Progress Toward Goals  OT Goals(current goals can now be found in the care plan section)     Acute Rehab OT Goals Patient Stated Goal: get home         AM-PAC PT "6 Clicks" Daily Activity     Outcome Measure   Help from another person eating meals?: None Help from another person taking care of personal grooming?: None Help from another person toileting, which includes using toliet, bedpan, or urinal?: None Help from another person bathing (including washing, rinsing, drying)?: None Help from another person to put  on and taking off regular upper body clothing?: None Help from another person to put on and taking off regular lower body clothing?: None 6 Click Score: 24    End of Session    OT Visit Diagnosis: Repeated falls (R29.6);History of falling (Z91.81);Muscle weakness (generalized) (M62.81)   Activity Tolerance Patient tolerated treatment well (limited due to lack of participation)   Patient Left with call bell/phone within reach;in chair   Nurse Communication          Time: 5396-7289 OT Time Calculation (min): 17 min  Charges: OT General Charges $OT Visit: 1 Visit OT Treatments $Self Care/Home Management : 8-22 mins  [place], [place]   Payton Mccallum D 03/03/2017, 2:47 PM

## 2017-03-03 NOTE — Progress Notes (Signed)
ANTICOAGULATION CONSULT NOTE - Follow Up Consult  Pharmacy Consult for warfarin Indication: atrial fibrillation, Hx PE  No Known Allergies  Patient Measurements: Height: 5' 8.11" (173 cm) Weight: 149 lb 14.6 oz (68 kg) IBW/kg (Calculated) : 68.65  Vital Signs: Temp: 97.6 F (36.4 C) (10/09 0423) Temp Source: Oral (10/09 0423) BP: 133/69 (10/09 0423) Pulse Rate: 60 (10/09 0423)  Labs:  Recent Labs  03/01/17 0359 03/02/17 0354 03/03/17 0415  HGB  --  9.0* 9.1*  HCT  --  29.2* 30.8*  PLT  --  198 217  LABPROT 44.0* 37.3* 31.9*  INR 4.72* 3.82 3.12  CREATININE 2.25* 2.11* 1.85*   Estimated Creatinine Clearance: 23.5 mL/min (A) (by C-G formula based on SCr of 1.85 mg/dL (H)).  Medications:  Prescriptions Prior to Admission  Medication Sig Dispense Refill Last Dose  . aspirin 81 MG EC tablet Take 81 mg by mouth daily.     02/27/2017 at 1130  . feeding supplement, ENSURE ENLIVE, (ENSURE ENLIVE) LIQD Take 237 mLs by mouth 2 (two) times daily between meals. 237 mL 12 02/27/2017 at Unknown time  . furosemide (LASIX) 40 MG tablet Take 1 tablet (40 mg total) by mouth daily. PATIENT NEEDS TO CONTACT OFFICE FOR ADDITIONAL REFILLS 90 tablet 0 02/27/2017 at Unknown time  . levothyroxine (SYNTHROID, LEVOTHROID) 50 MCG tablet Take 50 mcg by mouth daily.  4 02/27/2017 at Unknown time  . metoprolol (LOPRESSOR) 50 MG tablet Take 1 tablet (50 mg total) by mouth 2 (two) times daily. 180 tablet 3 02/27/2017 at 2130  . nitroGLYCERIN (NITROSTAT) 0.4 MG SL tablet Place 0.4 mg under the tongue every 5 (five) minutes as needed.   unk  . pantoprazole (PROTONIX) 40 MG tablet TAKE 1 TABLET (40 MG TOTAL) BY MOUTH DAILY. 30 tablet 11 02/27/2017 at Unknown time  . pravastatin (PRAVACHOL) 40 MG tablet Take 40 mg by mouth daily.   02/27/2017 at Unknown time  . predniSONE (DELTASONE) 5 MG tablet Take 5 mg by mouth daily.     02/27/2017 at Unknown time  . ULORIC 40 MG tablet TAKE 1 TABLET BY MOUTH EVERY DAY 30  tablet 11 02/27/2017 at Unknown time  . warfarin (COUMADIN) 2.5 MG tablet Take 2.5 mg by mouth daily.    02/27/2017 at 2130   Scheduled:  . aspirin EC  81 mg Oral Daily  . cephALEXin  250 mg Oral Q8H  . doxycycline  100 mg Oral Q12H  . febuxostat  40 mg Oral Daily  . feeding supplement (ENSURE ENLIVE)  237 mL Oral BID BM  . levothyroxine  50 mcg Oral QAC breakfast  . metoprolol tartrate  50 mg Oral BID  . pantoprazole  40 mg Oral Daily  . pravastatin  40 mg Oral Daily  . predniSONE  5 mg Oral Daily   PRN: acetaminophen **OR** acetaminophen, ondansetron **OR** ondansetron (ZOFRAN) IV  Assessment: 34 yoM with PMH prior PE, Afib on warfarin, MI, HTN, pulm HTN, admitted 10/6 with R arm cellulitis vs bursitis. Pharmacy to continue warfarin while admitted   Baseline INR supratherapeutic  Prior anticoagulation: warfarin 2.5 mg daily, LD 10/5  Significant events:  Today, 03/03/2017:  CBC: Hgb low but appears to be near baseline  INR decreasing, still slightly supratherapeutic   Drug interactions: Doxycycline may increase INR, ASA 81 mg  No bleeding issues per nursing  Eating 100% of meals  Goal of Therapy: INR 2-3  Plan:  Continue to hold warfarin today, likely resume Warfarin tomorrow at  home dose  Daily INR  CBC at least weekly while on warfarin  Monitor for signs of bleeding or thrombosis  Otho Bellows PharmD Pager [phone] 03/03/2017, 10:07 AM

## 2017-03-05 LAB — CULTURE, BLOOD (ROUTINE X 2)
Culture: NO GROWTH
Culture: NO GROWTH
SPECIAL REQUESTS: ADEQUATE
Special Requests: ADEQUATE

## 2017-05-11 DIAGNOSIS — L03113 Cellulitis of right upper limb: Secondary | ICD-10-CM

## 2017-06-19 ENCOUNTER — Other Ambulatory Visit: Payer: Self-pay | Admitting: Cardiovascular Disease

## 2017-06-19 NOTE — Telephone Encounter (Signed)
Rx has been sent to the pharmacy electronically. ° °

## 2017-08-12 ENCOUNTER — Other Ambulatory Visit: Payer: Self-pay | Admitting: Cardiovascular Disease

## 2017-09-11 ENCOUNTER — Encounter (HOSPITAL_BASED_OUTPATIENT_CLINIC_OR_DEPARTMENT_OTHER): Payer: Self-pay | Admitting: *Deleted

## 2017-09-11 ENCOUNTER — Other Ambulatory Visit: Payer: Self-pay

## 2017-09-11 ENCOUNTER — Emergency Department (HOSPITAL_BASED_OUTPATIENT_CLINIC_OR_DEPARTMENT_OTHER)
Admission: EM | Admit: 2017-09-11 | Discharge: 2017-09-11 | Disposition: A | Discharge status: Home / Self Care | Payer: Medicare Other | Attending: Physician Assistant | Admitting: Physician Assistant

## 2017-09-11 DIAGNOSIS — I129 Hypertensive chronic kidney disease with stage 1 through stage 4 chronic kidney disease, or unspecified chronic kidney disease: Secondary | ICD-10-CM | POA: Insufficient documentation

## 2017-09-11 DIAGNOSIS — Z466 Encounter for fitting and adjustment of urinary device: Secondary | ICD-10-CM | POA: Insufficient documentation

## 2017-09-11 DIAGNOSIS — R338 Other retention of urine: Secondary | ICD-10-CM | POA: Insufficient documentation

## 2017-09-11 DIAGNOSIS — Z7982 Long term (current) use of aspirin: Secondary | ICD-10-CM | POA: Insufficient documentation

## 2017-09-11 DIAGNOSIS — F0390 Unspecified dementia without behavioral disturbance: Secondary | ICD-10-CM | POA: Insufficient documentation

## 2017-09-11 DIAGNOSIS — Z955 Presence of coronary angioplasty implant and graft: Secondary | ICD-10-CM | POA: Insufficient documentation

## 2017-09-11 DIAGNOSIS — Z79899 Other long term (current) drug therapy: Secondary | ICD-10-CM | POA: Insufficient documentation

## 2017-09-11 DIAGNOSIS — Z87891 Personal history of nicotine dependence: Secondary | ICD-10-CM | POA: Diagnosis not present

## 2017-09-11 DIAGNOSIS — N184 Chronic kidney disease, stage 4 (severe): Secondary | ICD-10-CM | POA: Diagnosis not present

## 2017-09-11 DIAGNOSIS — I2510 Atherosclerotic heart disease of native coronary artery without angina pectoris: Secondary | ICD-10-CM | POA: Insufficient documentation

## 2017-09-11 NOTE — Discharge Instructions (Addendum)
Your catheter was not draining and it now is.  Please return with any concerns, bleeding, urinary tract symptoms, or other concerns.

## 2017-09-11 NOTE — ED Provider Notes (Addendum)
[facility] EMERGENCY DEPARTMENT Provider Note   CSN: [id number] Arrival date & time: 09/11/17  1628     History   Chief Complaint No chief complaint on file.   HPI Olin PiaRalph Z Mccamy is a 84 y.o. male.  HPI   84 year old male with non-draining catheter.  Patient reports that was placed by his home health nurse yesterday.  Since that he is only had 300 cc out.  He has feeling of incomplete drainage, pain.   Level 5 caveat dementia  Past Medical History:  Diagnosis Date  . Chronic anticoagulation 2011   on coumadin  . Coronary artery disease 2012   PCI-LAD & LCX, Promus Des  . Echocardiogram abnormal 11/07/2010   EF 50-55%, LA mild to mod dilated, mod MR, RV SYSTOLIC PRESSure 40-50 mild-mod pul. HTN   . H/O cardiovascular stress test 11/07/2010   new scar in LCX distribution  . Hyperlipidemia LDL goal < 70   . Hypertension   . Myocardial infarction (HCC) 05/23/2010   non-ST elevation MI  . Permanent atrial fibrillation (HCC)   . Pulmonary embolus (HCC) 05/23/2010  . Pulmonary hypertension (HCC)    mild to moderate    Patient Active Problem List   Diagnosis Date Noted  . Right arm cellulitis   . Acute lower UTI 03/01/2017  . Iron deficiency anemia   . CKD (chronic kidney disease) stage 4, GFR 15-29 ml/min (HCC) 02/28/2017  . Cellulitis of right arm 02/28/2017  . AKI (acute kidney injury) (HCC) 08/07/2016  . Urinary retention 08/07/2016  . Abdominal pain 08/06/2016  . Permanent atrial fibrillation (HCC) 03/28/2013  . Pulmonary embolus, 04/2010 03/28/2013  . Coronary artery disease   . Hypertension   . Hyperlipidemia LDL goal < 70   . Long term current use of anticoagulant therapy 08/10/2012  . Myocardial infarction (HCC) 05/23/2010    Past Surgical History:  Procedure Laterality Date  . CARDIAC CATHETERIZATION  05/23/10   high grade LAD and circumflex disease, EF 50% stented circumflex w/ Promus drug-eluding stent  . CARDIAC CATHETERIZATION   05/28/2010   LAD sten w/ Promus drug eluding stent  . CORONARY ANGIOPLASTY WITH STENT PLACEMENT          Home Medications    Prior to Admission medications   Medication Sig Start Date End Date Taking? Authorizing Provider  aspirin 81 MG EC tablet Take 81 mg by mouth daily.      [provider]  feeding supplement, ENSURE ENLIVE, (ENSURE ENLIVE) LIQD Take 237 mLs by mouth 2 (two) times daily between meals. 08/15/16   Albertine GratesXu, Fang, MD  furosemide (LASIX) 40 MG tablet Take 1 tablet (40 mg total) by mouth daily. PATIENT NEEDS TO CONTACT OFFICE FOR ADDITIONAL REFILLS 03/03/17   Rodolph Bonghompson, Daniel V, MD  levothyroxine (SYNTHROID, LEVOTHROID) 50 MCG tablet Take 50 mcg by mouth daily. 02/16/15   [provider]  metoprolol tartrate (LOPRESSOR) 50 MG tablet TAKE 1 TABLET (50 MG TOTAL) BY MOUTH 2 (TWO) TIMES DAILY. 08/12/17   Runell GessBerry, Jonathan J, MD  nitroGLYCERIN (NITROSTAT) 0.4 MG SL tablet Place 0.4 mg under the tongue every 5 (five) minutes as needed.    [provider]  ondansetron (ZOFRAN) 4 MG tablet Take 1 tablet (4 mg total) by mouth every 6 (six) hours as needed for nausea. 03/03/17   Rodolph Bonghompson, Daniel V, MD  pantoprazole (PROTONIX) 40 MG tablet TAKE 1 TABLET (40 MG TOTAL) BY MOUTH DAILY. 06/19/17   Runell GessBerry, Jonathan J, MD  pravastatin (PRAVACHOL)  40 MG tablet Take 40 mg by mouth daily. 02/07/13   [provider]  predniSONE (DELTASONE) 5 MG tablet Take 5 mg by mouth daily.      [provider]  ULORIC 40 MG tablet TAKE 1 TABLET BY MOUTH EVERY DAY 08/04/16   Runell Gess, MD  warfarin (COUMADIN) 2.5 MG tablet Take 1 tablet (2.5 mg total) by mouth daily. 03/04/17   Rodolph Bong, MD    Family History Family History  Family history unknown: Yes    Social History Social History   Tobacco Use  . Smoking status: Former Smoker    Last attempt to quit: 09/27/1973    Years since quitting: 43.9  . Smokeless tobacco: Never Used  Substance Use Topics  .  Alcohol use: No  . Drug use: No     Allergies   Patient has no known allergies.   Review of Systems Review of Systems  Genitourinary: Positive for decreased urine volume.  All other systems reviewed and are negative.    Physical Exam Updated Vital Signs BP (!) 169/102   Pulse 84   Temp 97.8 F (36.6 C) (Oral)   Resp 20   Ht 5' 6.5" (1.689 m)   Wt 72.6 kg (160 lb)   SpO2 95%   BMI 25.44 kg/m   Physical Exam  Constitutional: He is oriented to person, place, and time. He appears well-nourished.  HENT:  Head: Normocephalic.  Eyes: Conjunctivae are normal.  Cardiovascular: Normal rate.  Pulmonary/Chest: Effort normal.  Abdominal: Soft.  Mild fullness and tenderness over the bladder.  Genitourinary:  Genitourinary Comments: Catheter in place.  Neurological: He is oriented to person, place, and time.  Skin: Skin is warm and dry. He is not diaphoretic.  Psychiatric: He has a normal mood and affect. His behavior is normal.     ED Treatments / Results  Labs (all labs ordered are listed, but only abnormal results are displayed) Labs Reviewed - No data to display  EKG None  Radiology No results found.  Procedures Procedures (including critical care time)  Medications Ordered in ED Medications - No data to display   Initial Impression / Assessment and Plan / ED Course  I have reviewed the triage vital signs and the nursing notes.  Pertinent labs & imaging results that were available during my care of the patient were reviewed by me and considered in my medical decision making (see chart for details).     84 year old male with non-draining catheter.  Patient reports that was placed by his home health nurse yesterday.  Since that he is only had 300 cc out.  He has feeling of incomplete drainage, pain.   Level 5 caveat dementia  5:57 PM Bladder scanner shows 400 cc of urine, catheter obviously not draining.  Will replace.  6:36 PM Replaced and draining.   Patient will follow up with his home health nurse.  Not suspect infection, or other cause, likely due to replacement in the wrong place yesterday.  Final Clinical Impressions(s) / ED Diagnoses   Final diagnoses:  None    ED Discharge Orders    None       Abelino Derrick, MD 09/11/17 1836    Abelino Derrick, MD 09/11/17 [phone]

## 2017-09-11 NOTE — ED Triage Notes (Signed)
He has a foley catheter that stopped draining last night. Pt is uncomfortable.

## 2017-10-23 ENCOUNTER — Other Ambulatory Visit: Payer: Self-pay | Admitting: Cardiovascular Disease

## 2017-10-23 NOTE — Telephone Encounter (Signed)
Rx sent to pharmacy   

## 2017-11-21 ENCOUNTER — Other Ambulatory Visit: Payer: Self-pay | Admitting: Cardiovascular Disease

## 2017-12-23 ENCOUNTER — Other Ambulatory Visit: Payer: Self-pay | Admitting: Cardiovascular Disease

## 2017-12-24 DEATH — deceased

## 2018-03-29 IMAGING — CT CT EXTREM UP ENTIRE ARM*R* W/O CM
2 of 4 series · 10 of 33 positions shown, 12 images · non-contrast
Comparison: None.

CLINICAL DATA: Redness in the right arm. History of blood clots.
Weak pulse.

EXAM:
CT OF THE UPPER RIGHT EXTREMITY WITHOUT CONTRAST
TECHNIQUE: Multidetector CT imaging of the upper right extremity was performed
according to the standard protocol.

[Series 4: thin st · axial · 0.48mm/px · z∈[-686,-70]mm · 7 of 2511 slices shown, 9 images]
[im 229/2511  soft-tissue]
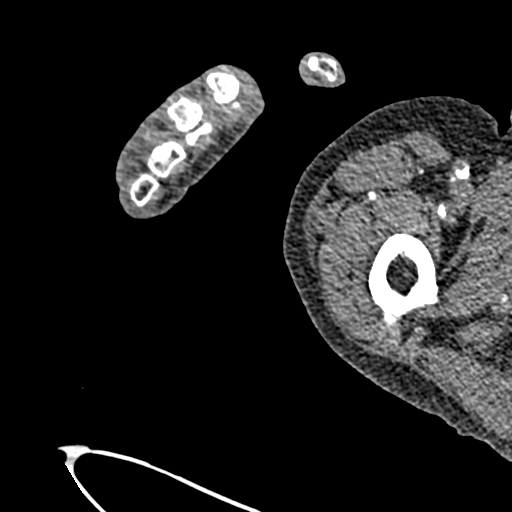
[im 229/2511  bone]
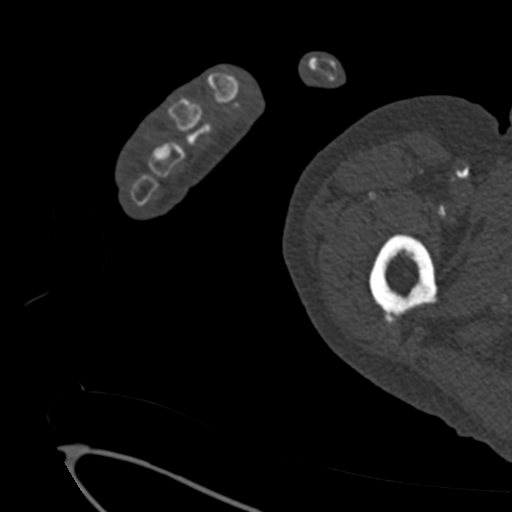
[im 685/2511  bone]
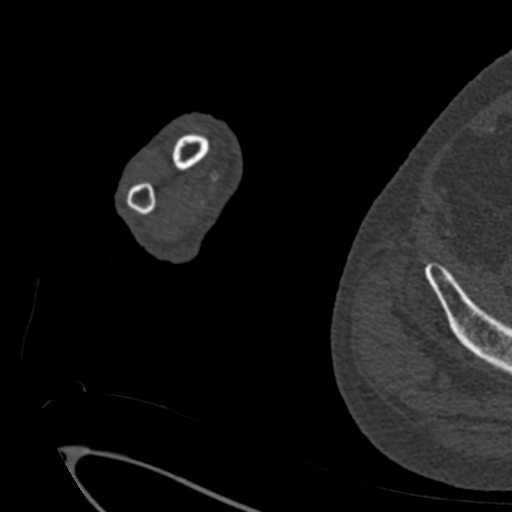
[im 913/2511  bone]
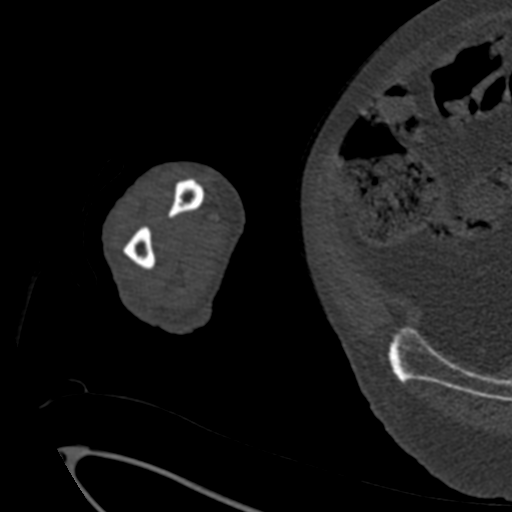
[im 1370/2511  bone]
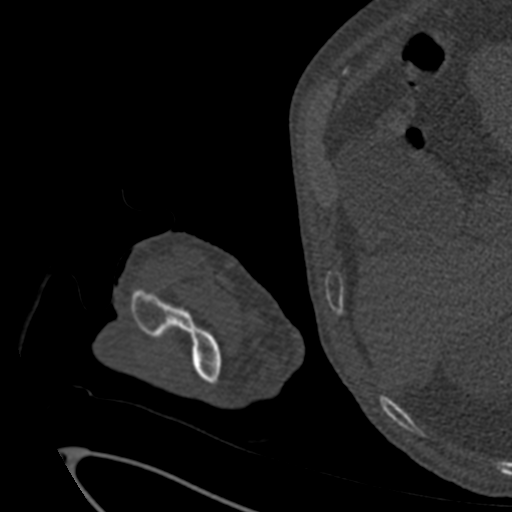
[im 1598/2511  soft-tissue]
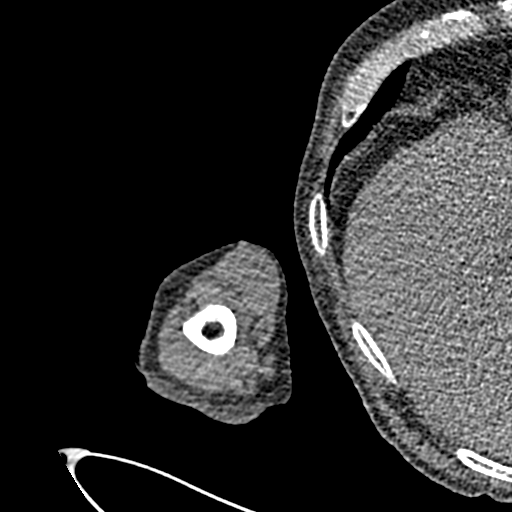
[im 1598/2511  bone]
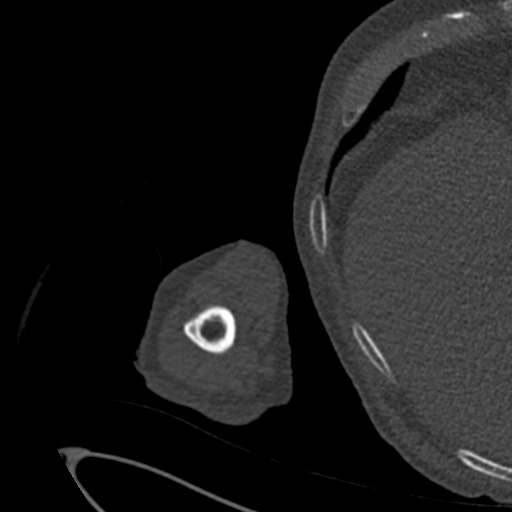
[im 1826/2511  bone]
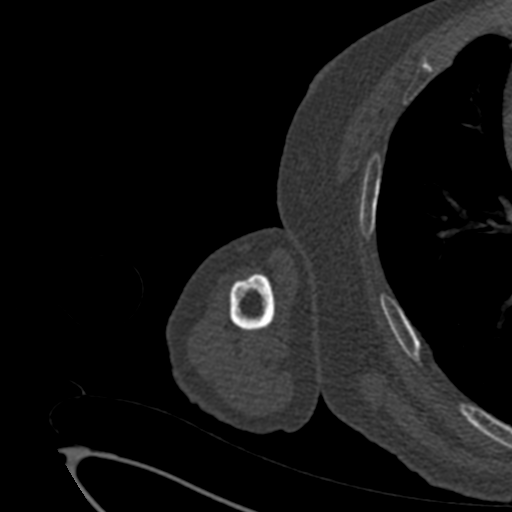
[im 2282/2511  bone]
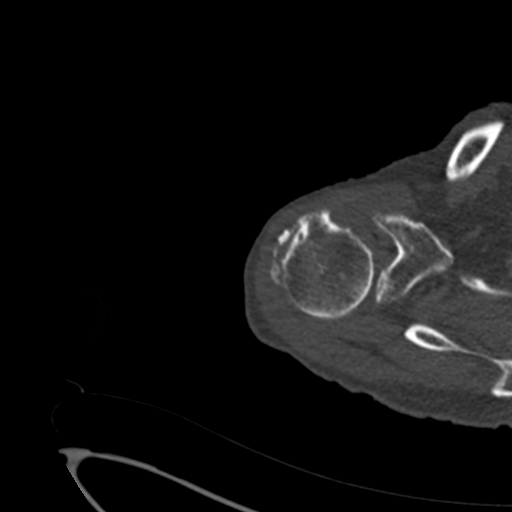

[Series 10: cor st · coronal · 0.45mm/px · 3 of 76 slices shown]
[im 16/76  bone]
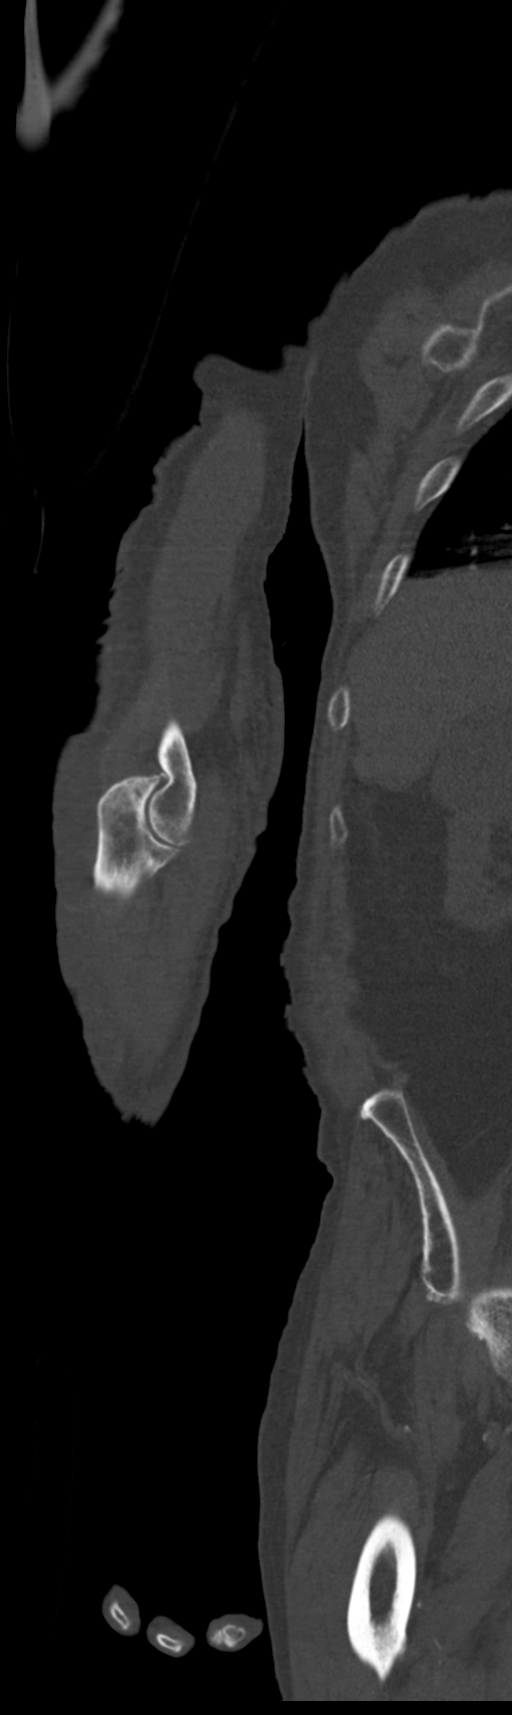
[im 31/76  bone]
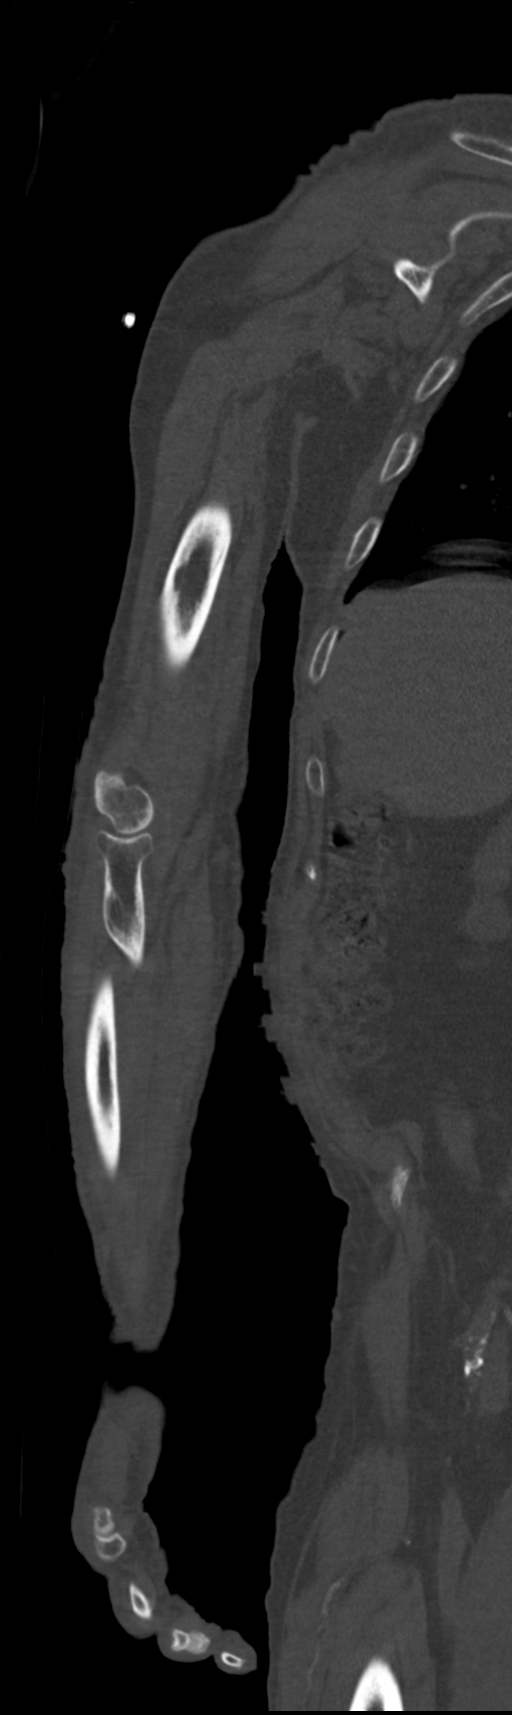
[im 46/76  bone]
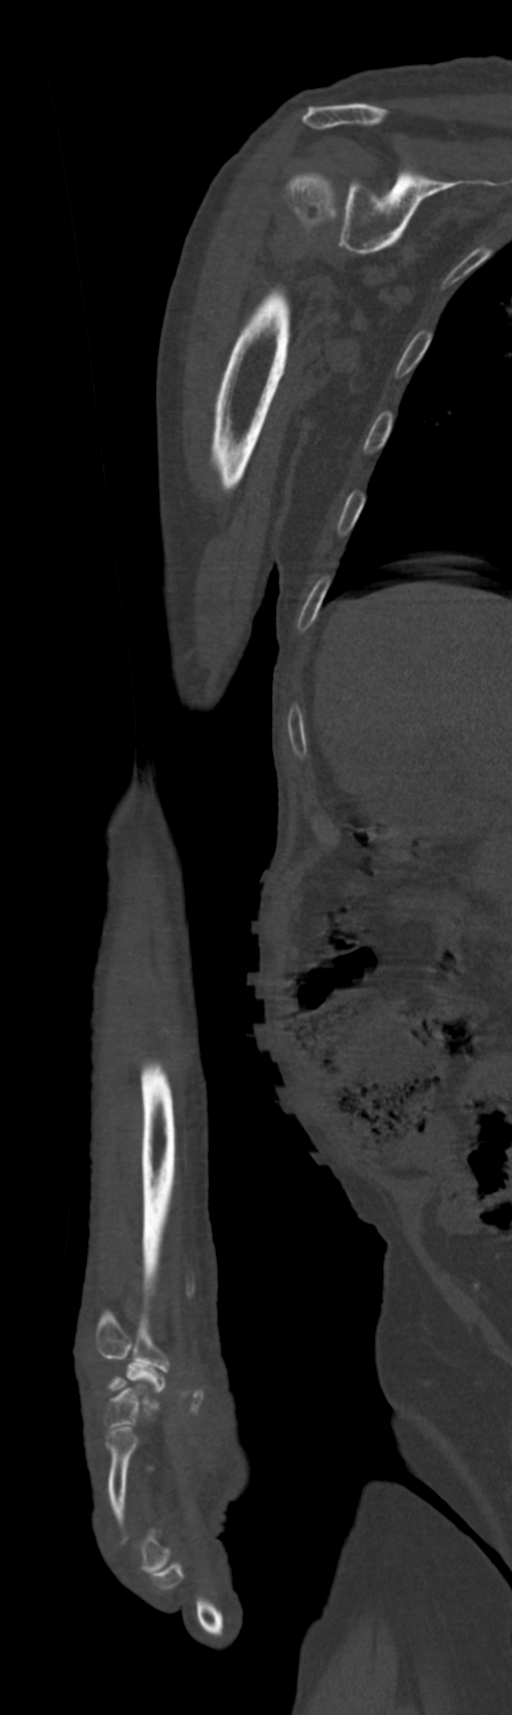

[10 of 33 positions shown; findings below may reference images not displayed]

FINDINGS: Bones/Joint/Cartilage

Degenerative changes in the glenohumeral joint with osteophytes on
the glenoid and humeral surfaces. Old appearing ossicles adjacent to
the greater tuberosity may represent tendinous calcifications or old
avulsion fragments. Mild degenerative changes in the elbow. Old
ununited ossicles over the olecranon process. No acute fracture is
identified. No destructive or expansile bone lesions.

Ligaments

Suboptimally assessed by CT.

Muscles and Tendons

No intramuscular mass or infiltration.  Fat planes are distinct.

Soft tissues

No significant soft tissue infiltration is identified. No loculated
fluid collections. Mild skin thickening suggested along the
posterior aspect of the distal humerus and forearm. This could
indicate cellulitis or edema. Vascular calcifications in the
tracheal, radial, and ulnar artery's. MRI would be more sensitive
for evaluation of soft tissues if clinically indicated.
IMPRESSION: 1. No acute bony abnormalities. Degenerative changes in the
glenohumeral and elbow joints.
2. Skin thickening suggested along the posterior aspect of the
distal humerus and forearm which could indicate cellulitis or edema.
No discrete abscess identified.
3. Vascular calcifications.
